# Patient Record
Sex: Female | Born: 1995 | Race: Black or African American | Hispanic: No | Marital: Single | Smoking: Never smoker
Health system: Southern US, Community
[De-identification: ages and names within clinical notes are randomized; demographics above are authoritative.]

## PROBLEM LIST (undated history)

## (undated) DIAGNOSIS — Z789 Other specified health status: Secondary | ICD-10-CM

## (undated) HISTORY — PX: WISDOM TOOTH EXTRACTION: SHX21

---

## 2020-04-08 ENCOUNTER — Other Ambulatory Visit: Payer: Self-pay

## 2020-04-08 ENCOUNTER — Emergency Department (HOSPITAL_COMMUNITY)
Admission: EM | Admit: 2020-04-08 | Discharge: 2020-04-08 | Disposition: A | Discharge status: Home / Self Care | Payer: Medicaid Other | Attending: Emergency Medicine | Admitting: Emergency Medicine

## 2020-04-08 DIAGNOSIS — O99891 Other specified diseases and conditions complicating pregnancy: Secondary | ICD-10-CM | POA: Diagnosis present

## 2020-04-08 DIAGNOSIS — Z3A16 16 weeks gestation of pregnancy: Secondary | ICD-10-CM | POA: Diagnosis not present

## 2020-04-08 DIAGNOSIS — M542 Cervicalgia: Secondary | ICD-10-CM | POA: Diagnosis not present

## 2020-04-08 LAB — POC URINE PREG, ED: Preg Test, Ur: POSITIVE — AB

## 2020-04-08 MED ORDER — ACETAMINOPHEN ER 650 MG PO TBCR
650.0000 mg | EXTENDED_RELEASE_TABLET | Freq: Three times a day (TID) | ORAL | 0 refills | Status: DC | PRN
Start: 2020-04-08 — End: 2024-01-21

## 2020-04-08 MED ORDER — ACETAMINOPHEN 325 MG PO TABS
650.0000 mg | ORAL_TABLET | Freq: Once | ORAL | Status: AC
  Administered 2020-04-08: 650 mg via ORAL
  Filled 2020-04-08: qty 2

## 2020-04-08 MED ORDER — LIDOCAINE 5 % EX PTCH
1.0000 | MEDICATED_PATCH | CUTANEOUS | Status: DC
  Administered 2020-04-08: 1 via TRANSDERMAL
  Filled 2020-04-08: qty 1

## 2020-04-08 NOTE — ED Notes (Signed)
Pt discharge instructions reviewed with the patient. The patient verbalized understanding of instructions. Pt discharged. 

## 2020-04-08 NOTE — Discharge Instructions (Addendum)
As discussed, I think your symptoms are related to muscle pain. I am sending you home with Tylenol as needed for pain. Ice area for symptomatic relief. I have included the number of the OBGYN. Please call today to schedule an appointment to establish care. Return to the ER for new or worsening symptoms.

## 2020-04-08 NOTE — ED Provider Notes (Signed)
[facility] EMERGENCY DEPARTMENT Provider Note   CSN: [id number] Arrival date & time: 04/08/20  7902     History No chief complaint on file.   Brandy Perez is a 24 y.o. female who is currently [redacted]w[redacted]d pregnant with no significant past medical history who presents to the ED due to right-sided neck pain.  Patient states pain started this morning upon waking up and is located from the right side of her neck down the right side of her chest. Pain is worse with movement. No treatment prior to arrival. Denies injury. She describes her pain as "soreness". Denies fever and chills. Denies central chest pain. Denies history of blood clots, recent surgeries, recent long immobilizations, and hormonal treatments. Denies right-sided weakness. Denies numbness/tingling.   Patient is also currently [redacted]w[redacted]d pregnant. She recently moved her from [place] and has not follow-up with an OBGYN since moving here. Her last OB visit was when she was roughly 7 weeks where an Korea was performed that confirmed intrauterine pregnancy. Patient denies abdominal pain, vaginal bleeding, and leakage from the vagina. Unsure when her last menstrual cycle was. She has not established care here in [place] yet.   History obtained from patient and past medical records. No interpreter used during encounter.      No past medical history on file.  There are no problems to display for this patient.   OB History   No obstetric history on file.     No family history on file.  Social History   Tobacco Use  . Smoking status: Not on file  Substance Use Topics  . Alcohol use: Not on file  . Drug use: Not on file    Home Medications Prior to Admission medications   Medication Sig Start Date End Date Taking? Authorizing Provider  acetaminophen (TYLENOL 8 HOUR) 650 MG CR tablet Take 1 tablet (650 mg total) by mouth every 8 (eight) hours as needed for pain. 04/08/20   Mannie Stabile, PA-C    Allergies      Patient has no known allergies.  Review of Systems   Review of Systems  Constitutional: Negative for chills and fever.  Respiratory: Negative for shortness of breath.   Cardiovascular: Negative for chest pain.  Gastrointestinal: Negative for abdominal pain, diarrhea, nausea and vomiting.  Musculoskeletal: Positive for arthralgias and neck pain.  Neurological: Negative for weakness and numbness.  All other systems reviewed and are negative.   Physical Exam Updated Vital Signs BP 130/69 (BP Location: Left Arm)   Pulse 85   Temp 98.2 F (36.8 C) (Oral)   Resp 17   Ht 5\' 5"  (1.651 m)   Wt 52.2 kg   SpO2 99%   BMI 19.14 kg/m   Physical Exam Vitals and nursing note reviewed.  Constitutional:      General: She is not in acute distress.    Appearance: She is not ill-appearing.  HENT:     Head: Normocephalic.  Eyes:     Pupils: Pupils are equal, round, and reactive to light.  Neck:      Comments: Reproducible right-sided neck tenderness. No cervical midline tenderness.  Cardiovascular:     Rate and Rhythm: Normal rate and regular rhythm.     Pulses: Normal pulses.     Heart sounds: Normal heart sounds. No murmur heard.  No friction rub. No gallop.   Pulmonary:     Effort: Pulmonary effort is normal.     Breath sounds: Normal breath sounds.  Abdominal:  General: Abdomen is flat. Bowel sounds are normal. There is no distension.     Palpations: Abdomen is soft.     Tenderness: There is no abdominal tenderness. There is no guarding or rebound.     Comments: Abdomen soft, nondistended, nontender to palpation in all quadrants without guarding or peritoneal signs. No rebound. FHR 130s.   Musculoskeletal:     Cervical back: Neck supple.     Comments: Able to move all 4 extremities without difficulty.  No lower extremity edema. Equal grip strength bilaterally.   Skin:    General: Skin is warm and dry.  Neurological:     General: No focal deficit present.     Mental  Status: She is alert.  Psychiatric:        Mood and Affect: Mood normal.        Behavior: Behavior normal.     ED Results / Procedures / Treatments   Labs (all labs ordered are listed, but only abnormal results are displayed) Labs Reviewed  POC URINE PREG, ED - Abnormal; Notable for the following components:      Result Value   Preg Test, Ur POSITIVE (*)    All other components within normal limits    EKG None  Radiology No results found.  Procedures Procedures (including critical care time)  Medications Ordered in ED Medications  lidocaine (LIDODERM) 5 % 1 patch (1 patch Transdermal Patch Applied 04/08/20 1234)  acetaminophen (TYLENOL) tablet 650 mg (650 mg Oral Given 04/08/20 1232)    ED Course  I have reviewed the triage vital signs and the nursing notes.  Pertinent labs & imaging results that were available during my care of the patient were reviewed by me and considered in my medical decision making (see chart for details).  Clinical Course as of Apr 09 1247  Fri Apr 08, 2020  1216 Preg Test, Ur(!): POSITIVE [CA]    Clinical Course User Index [CA] Jesusita Oka   MDM Rules/Calculators/A&P                         24 year old female presents to the ED due to right-sided neck pain that radiates down the right side of her body after waking up this morning. Denies injury. Denies fever and chills. No central chest pain or shortness of breath. Denies lower extremity edema. She is currently [redacted]w[redacted]d pregnant and recently moved here from [place] with no established OBGYN. No abdominal pain, vaginal bleeding, or fluid leakage. Good fetal HR on exam. No Korea warranted at this time. Upon arrival, vitals all within normal limits.  Patient is afebrile, not tachycardic or hypoxic.  Patient in no acute distress and non-ill-appearing.  Physical exam reassuring.  Reproducible right-sided neck tenderness.  Abdomen soft, nondistended, nontender. No rash to suggest shingles. Suspect  MSK etiology. No cervical midline tenderness. No upper extremity weakness to suggest central cord compression. Patient treated here in the ED with Tylenol and Lidoderm patch with improvement in symptoms. Upon reassessment, patient notes she is ready to go home. Will discharge patient with Tylenol for symptomatic relief. OBGYN number given to patient at discharge. Instructed patient to call today to schedule an appointment to establish OB care. Strict ED precautions discussed with patient. Patient states understanding and agrees to plan. Patient discharged home in no acute distress and stable vitals.  Final Clinical Impression(s) / ED Diagnoses Final diagnoses:  Neck pain  [redacted] weeks gestation of pregnancy    Rx /  DC Orders ED Discharge Orders         Ordered    acetaminophen (TYLENOL 8 HOUR) 650 MG CR tablet  Every 8 hours PRN     Discontinue  Reprint     04/08/20 [address] 04/08/20 1248    Derwood Kaplan, MD 04/09/20 1611

## 2020-04-08 NOTE — ED Triage Notes (Signed)
Pt here with c/o right shoulder and neck pain , pt recalls no trauma , pt is also approx [redacted] weeks pregnant

## 2020-04-20 ENCOUNTER — Other Ambulatory Visit: Payer: Self-pay

## 2020-04-20 ENCOUNTER — Ambulatory Visit (INDEPENDENT_AMBULATORY_CARE_PROVIDER_SITE_OTHER): Payer: Medicaid Other | Admitting: General Practice

## 2020-04-20 DIAGNOSIS — Z3201 Encounter for pregnancy test, result positive: Secondary | ICD-10-CM | POA: Diagnosis not present

## 2020-04-20 LAB — POCT PREGNANCY, URINE: Preg Test, Ur: POSITIVE — AB

## 2020-04-20 MED ORDER — PRENATAL VITAMIN PLUS LOW IRON 27-1 MG PO TABS: 1.0000 | ORAL_TABLET | Freq: Every day | ORAL | 11 refills | Status: DC

## 2020-04-20 NOTE — Progress Notes (Signed)
Patient dropped off urine sample at office for formal pregnancy confirmation. UPT +.  Called patient and she reports first positive home test 01/25/20. She is unsure of her LMP but has had several ultrasounds at a clinic [facility]. Patient states her most recent ultrasound was 5/10 and she was told her due date was 09/17/20. Patient states she hasn't had formal prenatal care thus far in pregnancy and the clinic she has been [facility] like a [facility]. Patient denies taking any meds/vitamins. Rx sent to pharmacy for PNV and patient was informed. Patient has new OB appt in office 8/5 and will follow up then.  Chase Caller RN BSN 04/20/20

## 2020-04-22 NOTE — Progress Notes (Signed)
I have reviewed this chart and agree with the RN/CMA assessment and management.    K. Meryl Abbigal Radich, M.D. Attending [facility] (Faculty Practice)   

## 2020-04-25 ENCOUNTER — Other Ambulatory Visit: Payer: Self-pay

## 2020-04-25 ENCOUNTER — Encounter (HOSPITAL_COMMUNITY): Payer: Self-pay | Admitting: Obstetrics and Gynecology

## 2020-04-25 ENCOUNTER — Inpatient Hospital Stay (HOSPITAL_COMMUNITY)
Admission: EM | Admit: 2020-04-25 | Discharge: 2020-04-25 | Disposition: A | Discharge status: Home / Self Care | Payer: Medicaid Other | Attending: Obstetrics and Gynecology | Admitting: Obstetrics and Gynecology

## 2020-04-25 DIAGNOSIS — O26892 Other specified pregnancy related conditions, second trimester: Secondary | ICD-10-CM | POA: Diagnosis present

## 2020-04-25 DIAGNOSIS — O26852 Spotting complicating pregnancy, second trimester: Secondary | ICD-10-CM | POA: Insufficient documentation

## 2020-04-25 DIAGNOSIS — O99322 Drug use complicating pregnancy, second trimester: Secondary | ICD-10-CM | POA: Diagnosis not present

## 2020-04-25 DIAGNOSIS — O4692 Antepartum hemorrhage, unspecified, second trimester: Secondary | ICD-10-CM | POA: Diagnosis not present

## 2020-04-25 DIAGNOSIS — Z3A19 19 weeks gestation of pregnancy: Secondary | ICD-10-CM | POA: Insufficient documentation

## 2020-04-25 DIAGNOSIS — O0932 Supervision of pregnancy with insufficient antenatal care, second trimester: Secondary | ICD-10-CM | POA: Diagnosis not present

## 2020-04-25 DIAGNOSIS — R109 Unspecified abdominal pain: Secondary | ICD-10-CM | POA: Diagnosis not present

## 2020-04-25 DIAGNOSIS — O0930 Supervision of pregnancy with insufficient antenatal care, unspecified trimester: Secondary | ICD-10-CM

## 2020-04-25 DIAGNOSIS — O368120 Decreased fetal movements, second trimester, not applicable or unspecified: Secondary | ICD-10-CM | POA: Diagnosis not present

## 2020-04-25 DIAGNOSIS — F1290 Cannabis use, unspecified, uncomplicated: Secondary | ICD-10-CM | POA: Diagnosis not present

## 2020-04-25 DIAGNOSIS — O4690 Antepartum hemorrhage, unspecified, unspecified trimester: Secondary | ICD-10-CM

## 2020-04-25 HISTORY — DX: Other specified health status: Z78.9

## 2020-04-25 LAB — URINALYSIS, ROUTINE W REFLEX MICROSCOPIC
Bilirubin Urine: NEGATIVE
Glucose, UA: NEGATIVE mg/dL
Hgb urine dipstick: NEGATIVE
Ketones, ur: NEGATIVE mg/dL
Leukocytes,Ua: NEGATIVE
Nitrite: NEGATIVE
Protein, ur: NEGATIVE mg/dL
Specific Gravity, Urine: 1.017 (ref 1.005–1.030)
pH: 9 — ABNORMAL HIGH (ref 5.0–8.0)

## 2020-04-25 NOTE — MAU Note (Signed)
Started bleeding last night, just spotting this morning. Has been cramping. Sent up from ED.  Has 1st appt on 8/5.  ( [facility] [place])

## 2020-04-25 NOTE — Progress Notes (Signed)
Pt left AMA after speaking with the provider. See provider notes for more information. Patient walked out of unit before signing AMA form. Previously reported cramping pain score of 7-8. All vital signs were WNL.

## 2020-04-25 NOTE — ED Triage Notes (Signed)
Pt with decreased fetal movement and vaginal bleeding. States [redacted]w[redacted]d pregnant with 3rd.

## 2020-04-25 NOTE — MAU Provider Note (Signed)
Chief Complaint: Vaginal Bleeding and Abdominal Pain   First Provider Initiated Contact with Patient 04/25/20 1144      SUBJECTIVE HPI: Brandy Perez is a 24 y.o. G3P1102 at [redacted]w[redacted]d by early ultrasound who presents to maternity admissions reporting abdominal cramping and light vaginal bleeding starting last night.  She reports seeing light red last night, enough to need a pad but this slowed to only light spotting today. Cramping started with the bleeding and has continued.     Location: lower abdomen Quality: cramping, sharp pain Severity: 7/10 on pain scale Duration: 12-15 hours Timing: intermittent, pt unable to time but reports they are frequent Modifying factors: none Associated signs and symptoms: vaginal bleeding  HPI  Past Medical History:  Diagnosis Date  . Medical history non-contributory    Past Surgical History:  Procedure Laterality Date  . WISDOM TOOTH EXTRACTION     Social History   Socioeconomic History  . Marital status: Single    Spouse name: Not on file  . Number of children: Not on file  . Years of education: Not on file  . Highest education level: Not on file  Occupational History  . Not on file  Tobacco Use  . Smoking status: Never Smoker  . Smokeless tobacco: Never Used  Vaping Use  . Vaping Use: Never used  Substance and Sexual Activity  . Alcohol use: Not Currently  . Drug use: Yes    Types: Marijuana  . Sexual activity: Yes  Other Topics Concern  . Not on file  Social History Narrative  . Not on file   Social Determinants of Health   Financial Resource Strain:   . Difficulty of Paying Living Expenses:   Food Insecurity:   . Worried About Programme researcher, broadcasting/film/video in the Last Year:   . Barista in the Last Year:   Transportation Needs:   . Freight forwarder (Medical):   Marland Kitchen Lack of Transportation (Non-Medical):   Physical Activity:   . Days of Exercise per Week:   . Minutes of Exercise per Session:   Stress:   . Feeling of  Stress :   Social Connections:   . Frequency of Communication with Friends and Family:   . Frequency of Social Gatherings with Friends and Family:   . Attends Religious Services:   . Active Member of Clubs or Organizations:   . Attends Banker Meetings:   Marland Kitchen Marital Status:   Intimate Partner Violence:   . Fear of Current or Ex-Partner:   . Emotionally Abused:   Marland Kitchen Physically Abused:   . Sexually Abused:    No current facility-administered medications on file prior to encounter.   Current Outpatient Medications on File Prior to Encounter  Medication Sig Dispense Refill  . acetaminophen (TYLENOL 8 HOUR) 650 MG CR tablet Take 1 tablet (650 mg total) by mouth every 8 (eight) hours as needed for pain. 15 tablet 0  . Prenatal Vit-Fe Fumarate-FA (PRENATAL VITAMIN PLUS LOW IRON) 27-1 MG TABS Take 1 tablet by mouth daily. 30 tablet 11   No Known Allergies  ROS:  Review of Systems  Constitutional: Negative for chills, fatigue and fever.  Respiratory: Negative for shortness of breath.   Cardiovascular: Negative for chest pain.  Gastrointestinal: Positive for abdominal pain.  Genitourinary: Positive for vaginal bleeding. Negative for difficulty urinating, dysuria, flank pain, pelvic pain, vaginal discharge and vaginal pain.  Neurological: Negative for dizziness and headaches.  Psychiatric/Behavioral: Negative.  I have reviewed patient's Past Medical Hx, Surgical Hx, Family Hx, Social Hx, medications and allergies.   Physical Exam   Patient Vitals for the past 24 hrs:  BP Temp Temp src Pulse Resp SpO2  04/25/20 1119 127/73 -- -- 77 -- --  04/25/20 1115 -- -- -- -- -- 100 %  04/25/20 1110 -- -- -- -- -- 100 %  04/25/20 1054 121/78 -- -- 83 16 100 %  04/25/20 1007 123/71 98.1 F (36.7 C) Oral 81 16 100 %   Constitutional: Well-developed, well-nourished female in no acute distress.  Cardiovascular: normal rate Respiratory: normal effort GI: Abd soft, non-tender. Pos  BS x 4 MS: Extremities nontender, no edema, normal ROM Neurologic: Alert and oriented x 4.  GU: Neg CVAT.  Pelvic exam: Pt declined  FHT 145 by doppler  LAB RESULTS No results found for this or any previous visit (from the past 24 hour(s)).     IMAGING No results found.  MAU Management/MDM: Orders Placed This Encounter  Procedures  . Urinalysis, Routine w reflex microscopic    No orders of the defined types were placed in this encounter.   History taken, discussed plan with patient to look for causes for bleeding and pain.  Pt has new OB visit on 05/05/20 and provider discussed ordering  anatomy US for patient today so she will not have delay.  Pt asked for ultrasound today "to make sure my baby is Ok".  Provider reassured pt that FHT were normal today and Korea may not be needed if bleeding is scant.  Pt reports there is very little bleeding today but she wants ultrasound because she has not had prenatal care yet and "no one has looked to see if my baby is Ok".   Provider again recommended evaluating with pelvic exam for causes of bleeding/pain and pt declined.  She left MAU AMA.  Outpatient anatomy US ordered as originally offered. Pt to f/u with new OB appt on 05/05/20 at [facility].      ASSESSMENT 1. Vaginal bleeding in pregnancy     PLAN Pt left AMA She has outpatient Korea and new OB appt for follow up   Sharen Counter Certified Nurse-Midwife 04/25/2020  11:45 AM

## 2020-04-25 NOTE — ED Provider Notes (Signed)
[facility] EMERGENCY DEPARTMENT Provider Note   CSN: [id number] Arrival date & time: 04/25/20  1001     History Chief Complaint  Patient presents with  . Vaginal Bleeding    Byanca Kasper is a 24 y.o. female G3P2 approximately 19 weeks presents for evaluation of abdominal cramping and vaginal bleeding.  Began yesterday evening.  [facility] [place] at beginning of pregnancy however has not followed up with outpatient OB/GYN follow-up.  She is taking prenatal vitamins.  States initially had heavy bright red bleeding yesterday evening which has gradually tapered off.  She is only had to change her pad once today.  No lightheadedness, dizziness.  No sensation of pushing, pelvic pain or fluid leakage.  Denies additional aggrieving or relieving factors.  History obtained from patient and past medical records.  No interpreter is used.  HPI     No past medical history on file.  There are no problems to display for this patient.   History reviewed  OB History   No obstetric history on file.     No family history on file.  Social History   Tobacco Use  . Smoking status: Not on file  Substance Use Topics  . Alcohol use: Not on file  . Drug use: Not on file    Home Medications Prior to Admission medications   Medication Sig Start Date End Date Taking? Authorizing Provider  acetaminophen (TYLENOL 8 HOUR) 650 MG CR tablet Take 1 tablet (650 mg total) by mouth every 8 (eight) hours as needed for pain. 04/08/20   Mannie Stabile, PA-C  Prenatal Vit-Fe Fumarate-FA (PRENATAL VITAMIN PLUS LOW IRON) 27-1 MG TABS Take 1 tablet by mouth daily. 04/20/20   Conan Bowens, MD    Allergies    Patient has no known allergies.  Review of Systems   Review of Systems  Constitutional: Negative.   HENT: Negative.   Respiratory: Negative.   Cardiovascular: Negative.   Gastrointestinal: Positive for abdominal pain  (Cramping).  Genitourinary: Positive for vaginal bleeding.  Musculoskeletal: Negative.   Skin: Negative.   Neurological: Negative.   All other systems reviewed and are negative.   Physical Exam Updated Vital Signs BP 123/71 (BP Location: Right Arm)   Pulse 81   Temp 98.1 F (36.7 C) (Oral)   Resp 16   SpO2 100%   Physical Exam Vitals and nursing note reviewed.  Constitutional:      General: She is not in acute distress.    Appearance: She is well-developed. She is not ill-appearing or toxic-appearing.  HENT:     Head: Atraumatic.  Eyes:     Pupils: Pupils are equal, round, and reactive to light.  Cardiovascular:     Rate and Rhythm: Normal rate.  Pulmonary:     Effort: No respiratory distress.  Abdominal:     General: There is no distension.     Comments: Gravid abdomen to umbilicus  Genitourinary:    Comments: Defer to MAU provider Musculoskeletal:        General: Normal range of motion.     Cervical back: Normal range of motion.  Skin:    General: Skin is warm and dry.  Neurological:     Mental Status: She is alert.     Gait: Gait is intact.     Comments: Ambulatory in room     ED Results / Procedures / Treatments   Labs (all labs ordered are listed, but  only abnormal results are displayed) Labs Reviewed - No data to display  EKG None  Radiology No results found.  Procedures Procedures (including critical care time)  Medications Ordered in ED Medications - No data to display  ED Course  I have reviewed the triage vital signs and the nursing notes.  Pertinent labs & imaging results that were available during my care of the patient were reviewed by me and considered in my medical decision making (see chart for details).  24 year old G3, P2 proximately 19 weeks presents for evaluation of lower abdominal cramping as well as vaginal bleeding.  Began yesterday evening however bleeding has gradually tapered off.  No lightheadedness or dizziness.  She is  afebrile, nonseptic, not ill-appearing.  She has stable vital signs.  Abdomen gravid up to umbilicus.  Has not established outpatient OB/GYN care throughout this pregnancy. Patient does not here to be in active distress low suspicion for imminent delivery of fetus.  Discussed with MAU provider who agrees to accept patient in transfer.  Patient hemodynamically stable prior to transfer to MAU.    MDM Rules/Calculators/A&P                           Final Clinical Impression(s) / ED Diagnoses Final diagnoses:  Vaginal bleeding in pregnancy    Rx / DC Orders ED Discharge Orders    None       Kevonte Vanecek A, PA-C 04/25/20 1030    Mancel Bale, MD 04/25/20 1738

## 2020-05-05 ENCOUNTER — Encounter: Payer: Self-pay | Admitting: Obstetrics and Gynecology

## 2020-05-05 ENCOUNTER — Ambulatory Visit (INDEPENDENT_AMBULATORY_CARE_PROVIDER_SITE_OTHER): Payer: Medicaid Other | Admitting: Obstetrics and Gynecology

## 2020-05-05 ENCOUNTER — Other Ambulatory Visit: Payer: Self-pay

## 2020-05-05 ENCOUNTER — Other Ambulatory Visit (HOSPITAL_COMMUNITY)
Admission: RE | Admit: 2020-05-05 | Discharge: 2020-05-05 | Disposition: A | Discharge status: Home / Self Care | Payer: Medicaid Other | Source: Ambulatory Visit | Attending: Obstetrics and Gynecology | Admitting: Obstetrics and Gynecology

## 2020-05-05 VITALS — BP 126/85 | HR 96 | Wt 110.4 lb

## 2020-05-05 DIAGNOSIS — Z8759 Personal history of other complications of pregnancy, childbirth and the puerperium: Secondary | ICD-10-CM

## 2020-05-05 DIAGNOSIS — O0990 Supervision of high risk pregnancy, unspecified, unspecified trimester: Secondary | ICD-10-CM | POA: Insufficient documentation

## 2020-05-05 DIAGNOSIS — G43909 Migraine, unspecified, not intractable, without status migrainosus: Secondary | ICD-10-CM | POA: Insufficient documentation

## 2020-05-05 DIAGNOSIS — F1291 Cannabis use, unspecified, in remission: Secondary | ICD-10-CM

## 2020-05-05 DIAGNOSIS — Z789 Other specified health status: Secondary | ICD-10-CM

## 2020-05-05 DIAGNOSIS — Z87898 Personal history of other specified conditions: Secondary | ICD-10-CM | POA: Insufficient documentation

## 2020-05-05 DIAGNOSIS — G43809 Other migraine, not intractable, without status migrainosus: Secondary | ICD-10-CM

## 2020-05-05 DIAGNOSIS — O0930 Supervision of pregnancy with insufficient antenatal care, unspecified trimester: Secondary | ICD-10-CM

## 2020-05-05 DIAGNOSIS — Z98891 History of uterine scar from previous surgery: Secondary | ICD-10-CM

## 2020-05-05 NOTE — Progress Notes (Signed)
New OB Note  05/05/2020   Clinic: Center for [facility]  Chief Complaint: NOB  Transfer of Care Patient: no  History of Present Illness: Brandy Perez is a 24 y.o. Q7Y1950 @ 20/5 weeks (EDC 12/18 [tentative], based on LMP.  Preg complicated by has History of VBAC; Migraines; History of marijuana use; History of poor fetal growth; and History of placenta abruption on their problem list.   No s/s of PTL. ?fetal quickening.   ROS: A 12-point review of systems was performed and negative, except as stated in the above HPI.  OBGYN History: As per HPI. OB History  Gravida Para Term Preterm AB Living  3 2 1 1   2   SAB TAB Ectopic Multiple Live Births          2    # Outcome Date GA Lbr Len/2nd Weight Sex Delivery Anes PTL Lv  3 Current           2 Term 08/02/18 [redacted]w[redacted]d  7 lb 2 oz (3.232 kg) M Vag-Spont EPI Y LIV  1 Preterm 02/03/11 [redacted]w[redacted]d  5 lb 1 oz (2.296 kg) F CS-LTranv Spinal  LIV     Complications: Abruptio Placenta    Prior children are healthy, doing well, and without any problems or issues: yes   Past Medical History: Past Medical History:  Diagnosis Date  . Medical history non-contributory     Past Surgical History: Past Surgical History:  Procedure Laterality Date  . CESAREAN SECTION    . WISDOM TOOTH EXTRACTION      Family History:  History reviewed. No pertinent family history.   Social History:  Social History   Socioeconomic History  . Marital status: Single    Spouse name: Not on file  . Number of children: Not on file  . Years of education: Not on file  . Highest education level: Not on file  Occupational History  . Not on file  Tobacco Use  . Smoking status: Never Smoker  . Smokeless tobacco: Never Used  Vaping Use  . Vaping Use: Never used  Substance and Sexual Activity  . Alcohol use: Not Currently  . Drug use: Yes    Types: Marijuana  . Sexual activity: Yes    Birth control/protection: None  Other Topics Concern  .  Not on file  Social History Narrative  . Not on file   Social Determinants of Health   Financial Resource Strain:   . Difficulty of Paying Living Expenses:   Food Insecurity:   . Worried About [redacted]w[redacted]d in the Last Year:   . Programme researcher, broadcasting/film/video in the Last Year:   Transportation Needs:   . Barista (Medical):   Freight forwarder Lack of Transportation (Non-Medical):   Physical Activity:   . Days of Exercise per Week:   . Minutes of Exercise per Session:   Stress:   . Feeling of Stress :   Social Connections:   . Frequency of Communication with Friends and Family:   . Frequency of Social Gatherings with Friends and Family:   . Attends Religious Services:   . Active Member of Clubs or Organizations:   . Attends [employer] Meetings:   Banker Marital Status:   Intimate Partner Violence:   . Fear of Current or Ex-Partner:   . Emotionally Abused:   [employer] Physically Abused:   . Sexually Abused:     Allergy: No Known Allergies  Current Outpatient Medications: Prenatal vitamin  Physical Exam:   BP 126/85   Pulse 96   Wt 110 lb 6.4 oz (50.1 kg)   BMI 18.37 kg/m  Body mass index is 18.37 kg/m. Contractions: Not present Vag. Bleeding: None. Fundal height: 18 FHTs: 150s  General appearance: Well nourished, well developed female in no acute distress.  Neck:  Supple, normal appearance, and no thyromegaly  Cardiovascular: S1, S2 normal, no murmur, rub or gallop, regular rate and rhythm Respiratory:  Clear to auscultation bilateral. Normal respiratory effort Abdomen: positive bowel sounds and no masses, hernias; diffusely non tender to palpation, non distended Breasts: patient denies any breast s/s. Neuro/Psych:  Normal mood and affect.  Skin:  Warm and dry.  Lymphatic:  No inguinal lymphadenopathy.   Pelvic exam: is not limited by body habitus EGBUS: within normal limits, Vagina: within normal limits and with no blood in the vault, Cervix: normal appearing cervix  without discharge or lesions, closed/long/high, Uterus:  enlarged, c/w 18-20 week size, and Adnexa:  normal adnexa and no mass, fullness, tenderness  Laboratory: deferred  Imaging:  None. Pt [facility]. No records available  Assessment: pt doing well  Plan: 1. Supervision of high risk pregnancy, antepartum Routine care. Has anatomy for next week. Pt dehydrated so unable to do natera screening today. Offer at [employer].  - Cytology - PAP( [facility]) - Culture, OB Urine - CBC/D/Plt+RPR+Rh+ABO+Rub Ab... - AFP, Serum, Open Spina Bifida - VITAMIN D 25 Hydroxy (Vit-D Deficiency, Fractures)  2. Weight gain advised 25-30lbs. She said got into the 120s with her las pregnancy  3. History of VBAC Desires with this one. Can d/w her more later and sign consent  4. Other migraine without status migrainosus, not intractable  5. History of marijuana use counseled  6. History of poor fetal growth Resolved at 35wks with last pregnancy. Follow [facility]Hs. May consider q4-6wk serial growth u/s  7. History of placenta abruption With G1 at 36wks and need for c/s.   Problem list reviewed and updated.  Follow up in 4 weeks.  The nature of [facility] with multiple MDs and other Advanced Practice Providers was explained to patient; also emphasized that residents, students are part of our team.  >50% of 30 min visit spent on counseling and coordination of care.     Cornelia Copa MD Attending Center for [facility])

## 2020-05-07 LAB — AFP, SERUM, OPEN SPINA BIFIDA
AFP MoM: 0.79
AFP Value: 66.5 ng/mL
Gest. Age on Collection Date: 20.7 weeks
Maternal Age At EDD: 26.4 yr
OSBR Risk 1 IN: 10000
Test Results:: NEGATIVE
Weight: 115 [lb_av]

## 2020-05-07 LAB — CBC/D/PLT+RPR+RH+ABO+RUB AB...
Antibody Screen: NEGATIVE
Basophils Absolute: 0.1 10*3/uL (ref 0.0–0.2)
Basos: 1 %
EOS (ABSOLUTE): 0.2 10*3/uL (ref 0.0–0.4)
Eos: 2 %
HCV Ab: <0.1 s/co ratio (ref 0.0–0.9)
HIV Screen 4th Generation wRfx: NONREACTIVE
Hematocrit: 34.1 % (ref 34.0–46.6)
Hemoglobin: 11.8 g/dL (ref 11.1–15.9)
Hepatitis B Surface Ag: NEGATIVE
Immature Grans (Abs): 0.1 10*3/uL (ref 0.0–0.1)
Immature Granulocytes: 1 %
Lymphocytes Absolute: 2.3 10*3/uL (ref 0.7–3.1)
Lymphs: 25 %
MCH: 30.3 pg (ref 26.6–33.0)
MCHC: 34.6 g/dL (ref 31.5–35.7)
MCV: 87 fL (ref 79–97)
Monocytes Absolute: 0.8 10*3/uL (ref 0.1–0.9)
Monocytes: 8 %
Neutrophils Absolute: 5.8 10*3/uL (ref 1.4–7.0)
Neutrophils: 63 %
Platelets: 274 10*3/uL (ref 150–450)
RBC: 3.9 x10E6/uL (ref 3.77–5.28)
RDW: 13.1 % (ref 11.7–15.4)
RPR Ser Ql: NONREACTIVE
Rh Factor: POSITIVE
Rubella Antibodies, IGG: 1.28 index (ref >0.99)
WBC: 9.2 10*3/uL (ref 3.4–10.8)

## 2020-05-07 LAB — CULTURE, OB URINE

## 2020-05-07 LAB — VITAMIN D 25 HYDROXY (VIT D DEFICIENCY, FRACTURES): Vit D, 25-Hydroxy: 19.7 ng/mL — ABNORMAL LOW (ref 30.0–100.0)

## 2020-05-07 LAB — URINE CULTURE, OB REFLEX

## 2020-05-07 LAB — HCV INTERPRETATION

## 2020-05-09 ENCOUNTER — Encounter: Payer: Self-pay | Admitting: Obstetrics and Gynecology

## 2020-05-09 ENCOUNTER — Telehealth: Payer: Self-pay | Admitting: Lactation Services

## 2020-05-09 DIAGNOSIS — R7989 Other specified abnormal findings of blood chemistry: Secondary | ICD-10-CM | POA: Insufficient documentation

## 2020-05-09 LAB — CYTOLOGY - PAP
Chlamydia: NEGATIVE
Comment: NEGATIVE
Comment: NORMAL
Diagnosis: NEGATIVE
Neisseria Gonorrhea: NEGATIVE

## 2020-05-09 MED ORDER — VITAMIN D (ERGOCALCIFEROL) 1.25 MG (50000 UNIT) PO CAPS
50000.0000 [IU] | ORAL_CAPSULE | ORAL | 0 refills | Status: DC
Start: 2020-05-09 — End: 2020-07-15

## 2020-05-09 NOTE — Telephone Encounter (Signed)
Called patient to inform her of results of Vitamin D and prescription at the Pharmacy.   Patient did not answer.LM for patient to call the office for results. LM for patient that a prescription has been sent to her Pharmacy and advised to pick up and start taking. Call back number given.

## 2020-05-09 NOTE — Progress Notes (Signed)
Home medicaid form complete.

## 2020-05-09 NOTE — Addendum Note (Signed)
Addended by: Penuelas Bing on: 05/09/2020 08:23 AM   Modules accepted: Orders

## 2020-05-09 NOTE — Telephone Encounter (Signed)
-----   Message from [place] Bing, MD sent at 05/09/2020  8:21 AM EDT ----- Can you tell her that I sent in vitamin d for her b/c it was low? thanks

## 2020-05-11 ENCOUNTER — Ambulatory Visit: Payer: Medicaid Other | Attending: Advanced Practice Midwife

## 2020-05-11 ENCOUNTER — Other Ambulatory Visit: Payer: Self-pay | Admitting: *Deleted

## 2020-05-11 ENCOUNTER — Other Ambulatory Visit: Payer: Self-pay

## 2020-05-11 ENCOUNTER — Encounter: Payer: Self-pay | Admitting: *Deleted

## 2020-05-11 DIAGNOSIS — O0932 Supervision of pregnancy with insufficient antenatal care, second trimester: Secondary | ICD-10-CM | POA: Diagnosis present

## 2020-05-11 DIAGNOSIS — O09212 Supervision of pregnancy with history of pre-term labor, second trimester: Secondary | ICD-10-CM

## 2020-05-11 DIAGNOSIS — O09292 Supervision of pregnancy with other poor reproductive or obstetric history, second trimester: Secondary | ICD-10-CM

## 2020-05-11 DIAGNOSIS — Z3A20 20 weeks gestation of pregnancy: Secondary | ICD-10-CM | POA: Insufficient documentation

## 2020-05-11 DIAGNOSIS — O34219 Maternal care for unspecified type scar from previous cesarean delivery: Secondary | ICD-10-CM | POA: Diagnosis not present

## 2020-05-11 DIAGNOSIS — Z362 Encounter for other antenatal screening follow-up: Secondary | ICD-10-CM

## 2020-05-11 DIAGNOSIS — Z3A19 19 weeks gestation of pregnancy: Secondary | ICD-10-CM

## 2020-05-11 DIAGNOSIS — O0930 Supervision of pregnancy with insufficient antenatal care, unspecified trimester: Secondary | ICD-10-CM

## 2020-06-08 ENCOUNTER — Ambulatory Visit: Payer: Medicaid Other | Attending: Obstetrics and Gynecology

## 2020-06-08 ENCOUNTER — Encounter: Payer: Self-pay | Admitting: *Deleted

## 2020-06-08 ENCOUNTER — Ambulatory Visit: Payer: Medicaid Other | Admitting: *Deleted

## 2020-06-08 ENCOUNTER — Other Ambulatory Visit: Payer: Self-pay

## 2020-06-08 VITALS — BP 134/78 | HR 92

## 2020-06-08 DIAGNOSIS — O09212 Supervision of pregnancy with history of pre-term labor, second trimester: Secondary | ICD-10-CM | POA: Diagnosis not present

## 2020-06-08 DIAGNOSIS — O09292 Supervision of pregnancy with other poor reproductive or obstetric history, second trimester: Secondary | ICD-10-CM | POA: Diagnosis not present

## 2020-06-08 DIAGNOSIS — Z362 Encounter for other antenatal screening follow-up: Secondary | ICD-10-CM

## 2020-06-08 DIAGNOSIS — O34219 Maternal care for unspecified type scar from previous cesarean delivery: Secondary | ICD-10-CM

## 2020-06-08 DIAGNOSIS — Z3A24 24 weeks gestation of pregnancy: Secondary | ICD-10-CM

## 2020-06-08 IMAGING — US US MFM OB FOLLOW-UP
1 series · 14 of 28 positions shown · non-contrast
Comparison: none

[Series 1: us mfm ob follow-up · 42 acquisitions, 14 frames shown]
[im 2/42]
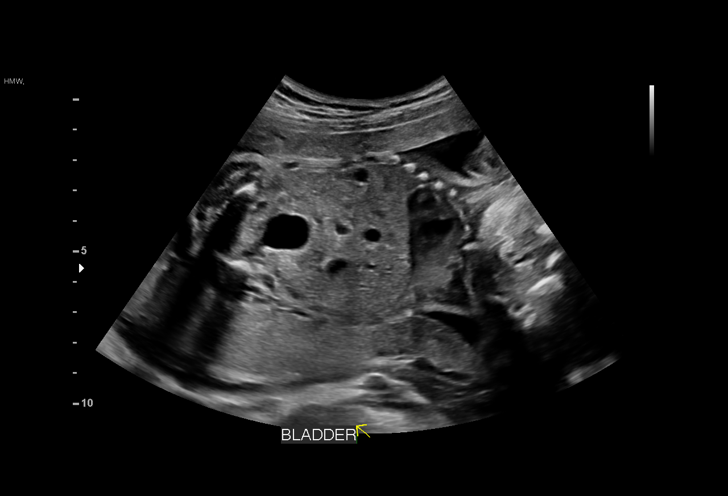
[im 5/42]
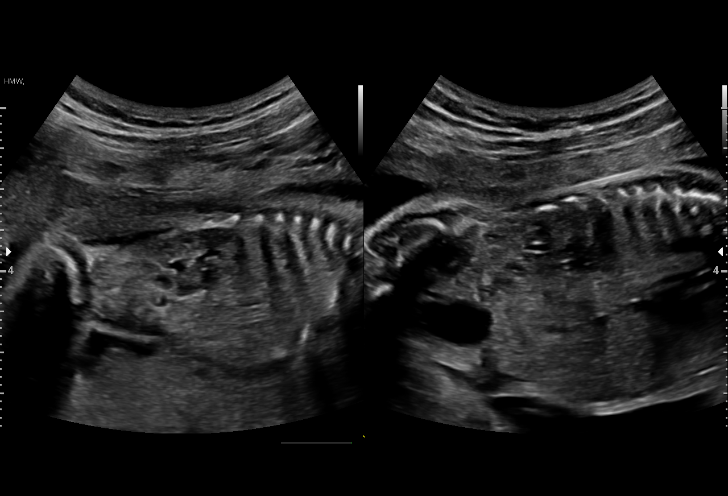
[im 8/42]
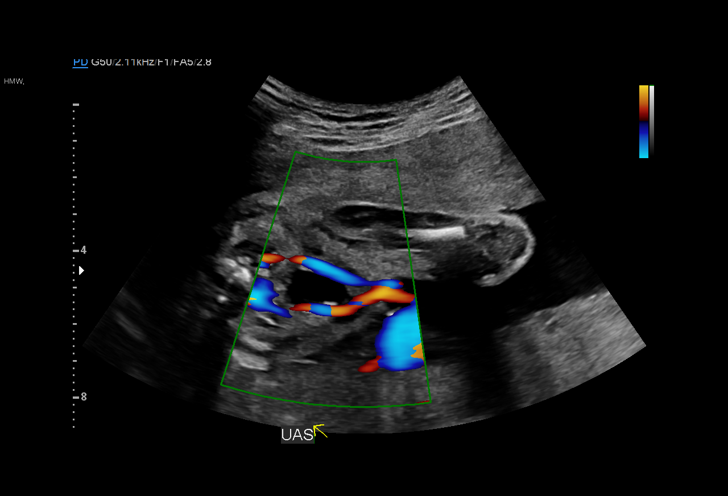
[im 11/42]
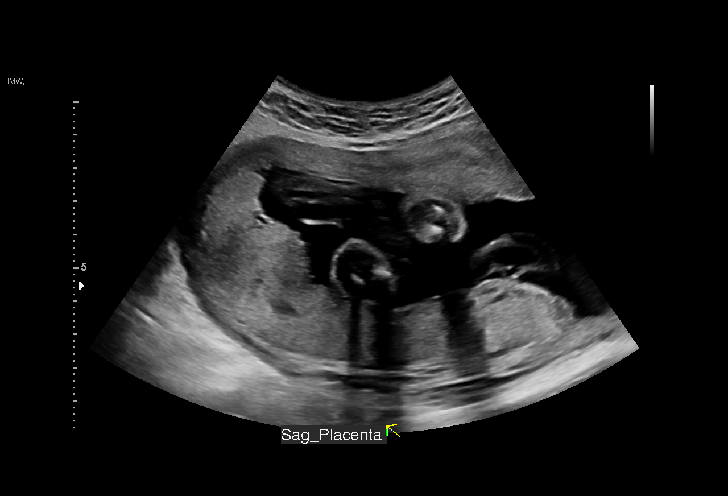
[im 14/42]
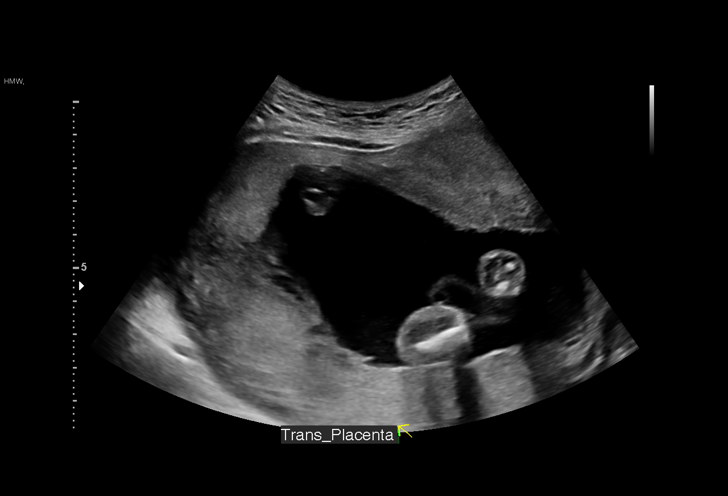
[im 17/42]
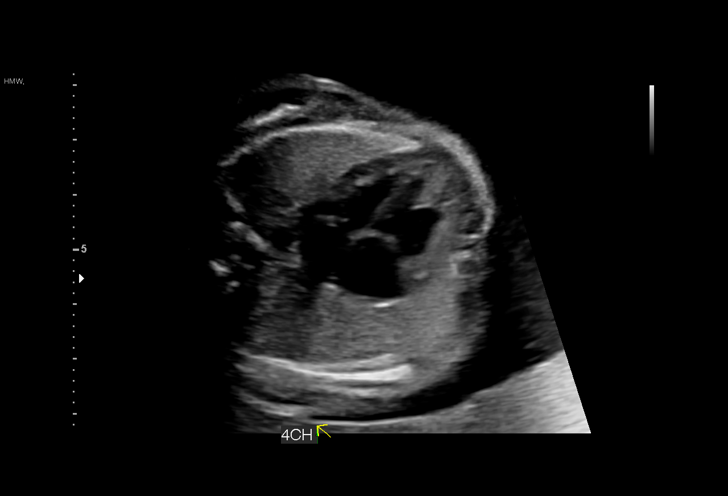
[im 20/42]
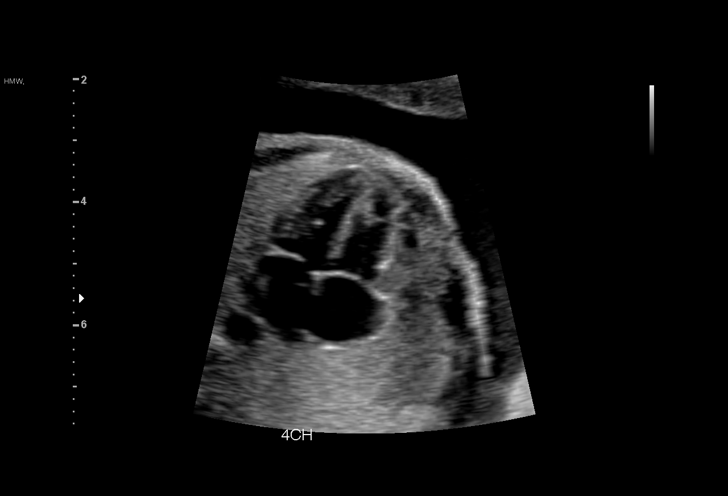
[im 23/42]
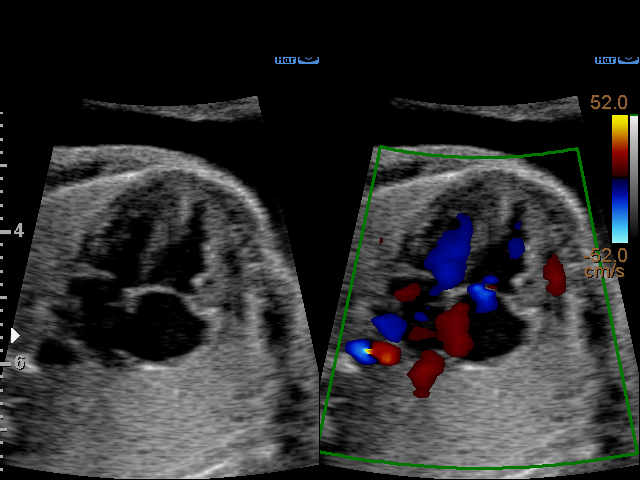
[im 26/42]
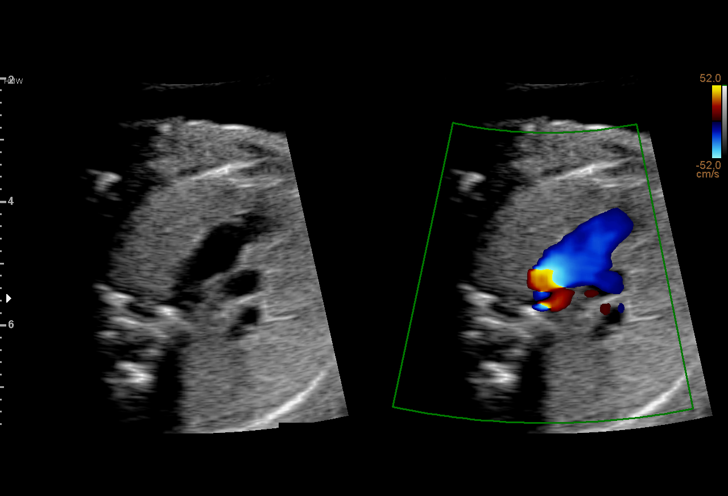
[im 29/42]
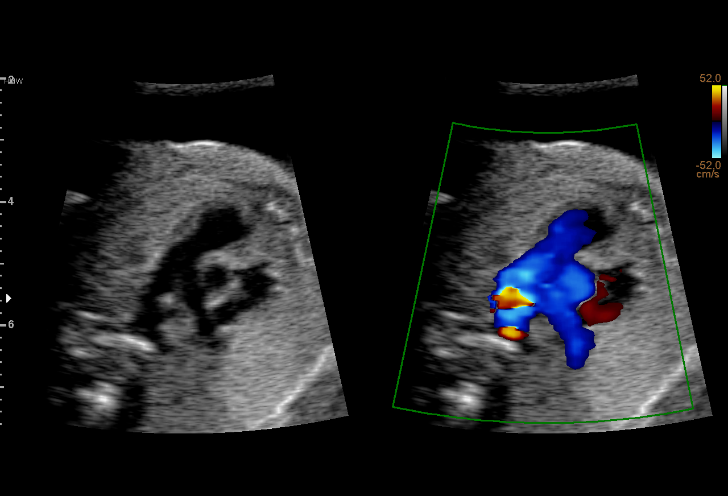
[im 32/42]
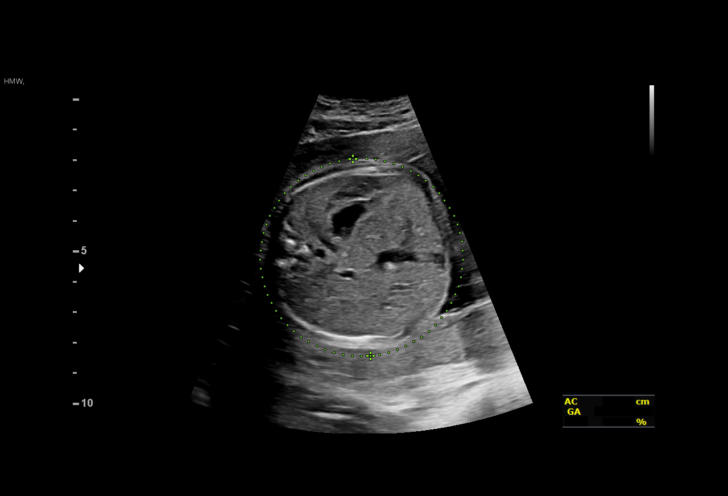
[im 35/42]
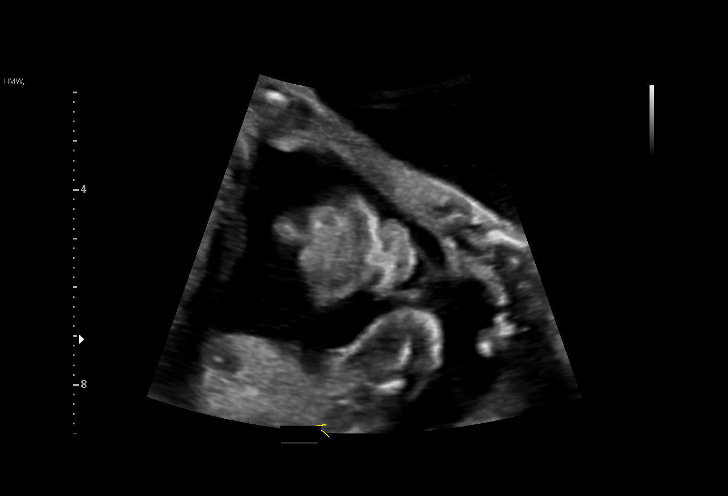
[im 38/42]
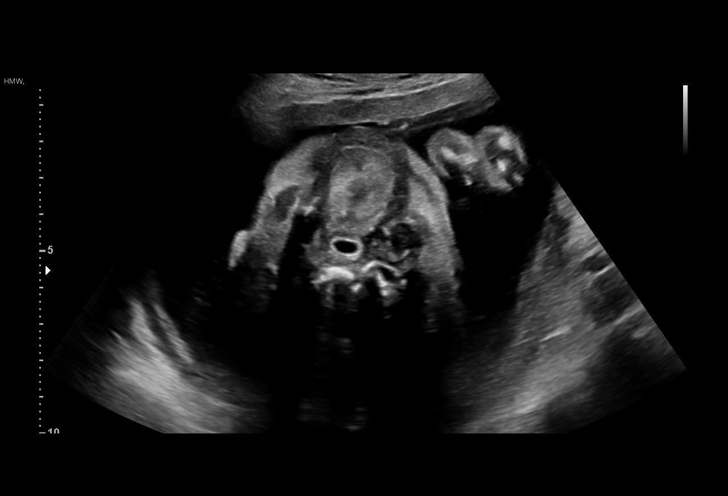
[im 42/42]
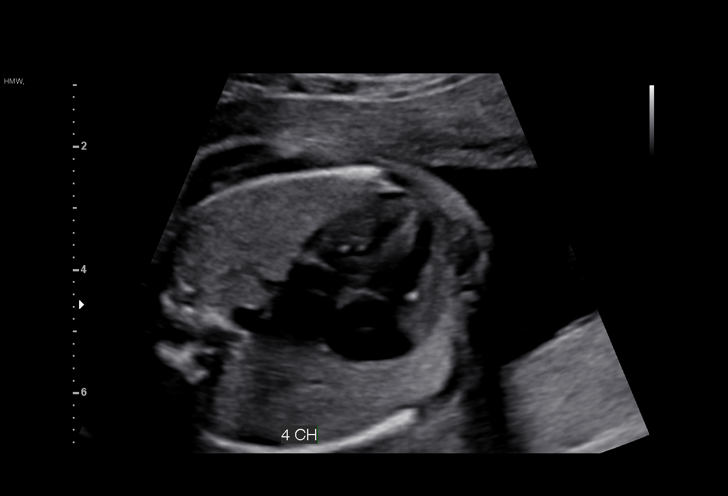

[14 of 28 positions shown; findings below may reference images not displayed]

Indications

 24 weeks gestation of pregnancy
 Prior poor obstetrical history antepartum,      [LQ]
 second trimester (placental abruption)
 History of cesarean delivery, currently         [LQ]
 pregnant
 History of pre-term deliveries                  [LQ]
 Encounter for other antenatal screening         [LQ]
 follow-up
Fetal Evaluation

 Num Of Fetuses:          1
 Fetal Heart Rate(bpm):   151
 Cardiac Activity:        Observed
 Presentation:            Cephalic
 Placenta:                Posterior
 P. Cord Insertion:       Visualized, central

 Amniotic Fluid
 AFI FV:      Within normal limits

                             Largest Pocket(cm)

Biometry
 BPD:      64.7  mm     G. Age:  26w 1d         94  %    CI:        79.66   %    70 - 86
                                                         FL/HC:       18.2  %    18.7 -
 HC:      229.1  mm     G. Age:  25w 0d         56  %    HC/AC:       1.11       1.05 -
 AC:      205.7  mm     G. Age:  25w 1d         69  %    FL/BPD:      64.5  %    71 - 87
 FL:       41.7  mm     G. Age:  23w 4d         17  %    FL/AC:       20.3  %    20 - 24

 Est. FW:     715   gm     1 lb 9 oz     56  %
OB History

 Gravidity:    3         Term:   1        Prem:   1
 Living:       2
Gestational Age

 Clinical EDD:  25w 4d                                        EDD:   [DATE]
 U/S Today:     25w 0d                                        EDD:   [DATE]
 Best:          24w 2d     Det. By:  U/S  ([DATE])          EDD:   [DATE]
Anatomy

 Cranium:               Appears normal         LVOT:                   Appears normal
 Cavum:                 Previously seen        Aortic Arch:            Previously seen
 Ventricles:            Appears normal         Ductal Arch:            Previously seen
 Choroid Plexus:        Previously seen        Diaphragm:              Appears normal
 Cerebellum:            Previously seen        Stomach:                Appears normal, left
                                                                       sided
 Posterior Fossa:       Previously seen        Abdomen:                Previously seen
 Nuchal Fold:           Not applicable (>20    Abdominal Wall:         Previously seen
                        wks GA)
 Face:                  Appears normal         Cord Vessels:           Previously seen
                        (orbits and profile)
 Lips:                  Appears normal         Kidneys:                Appear normal
 Palate:                Previously seen        Bladder:                Appears normal
 Thoracic:              Appears normal         Spine:                  Previously seen
 Heart:                 Appears normal; EIF    Upper Extremities:      Previously seen
 RVOT:                  Appears normal         Lower Extremities:      Previously seen

 Other:  Fetus appears to be female. Heels/feet and open hands/5th digits
         visualized prev. Open hands visualized prev. 3VV and 3VTV
         visualized prev.
Cervix Uterus Adnexa

 Cervix
 Not visualized (advanced GA >[LQ])

 Uterus
 No abnormality visualized.

 Right Ovary
 No adnexal mass visualized.

 Left Ovary
 No adnexal mass visualized.
 Cul De Sac
 No free fluid seen.

 Adnexa
 No abnormality visualized.
Impression

 Follow up growth due to suboptimal anatomy obtained
 previously.
 Normal interval growth with measurements consistent with
 dates established at last visit
 There is good fetal movment and amniotic fluid volume.
Recommendations

 Follow up as clinically indicated.

## 2020-06-09 ENCOUNTER — Encounter: Payer: Medicaid Other | Admitting: Nurse Practitioner

## 2020-06-14 ENCOUNTER — Encounter: Payer: Medicaid Other | Admitting: Certified Nurse Midwife

## 2020-06-27 ENCOUNTER — Other Ambulatory Visit: Payer: Self-pay

## 2020-06-27 ENCOUNTER — Ambulatory Visit (INDEPENDENT_AMBULATORY_CARE_PROVIDER_SITE_OTHER): Payer: Medicaid Other | Admitting: Student

## 2020-06-27 VITALS — BP 121/82 | HR 96 | Wt 118.0 lb

## 2020-06-27 DIAGNOSIS — Z3402 Encounter for supervision of normal first pregnancy, second trimester: Secondary | ICD-10-CM

## 2020-06-27 DIAGNOSIS — Z3A27 27 weeks gestation of pregnancy: Secondary | ICD-10-CM

## 2020-06-27 DIAGNOSIS — Z8759 Personal history of other complications of pregnancy, childbirth and the puerperium: Secondary | ICD-10-CM

## 2020-06-27 MED ORDER — PRENATAL GUMMIES/DHA & FA 0.4-32.5 MG PO CHEW: 1.0000 | CHEWABLE_TABLET | Freq: Every day | ORAL | 2 refills | Status: DC

## 2020-06-27 NOTE — Progress Notes (Signed)
   PRENATAL VISIT NOTE  Subjective:  Brandy Perez is a 24 y.o. G3P1102 at [redacted]w[redacted]d being seen today for ongoing prenatal care.  She is currently monitored for the following issues for this high-risk pregnancy and has History of VBAC; Migraines; History of marijuana use; History of poor fetal growth; History of placenta abruption; Supervision of high risk pregnancy, antepartum; Late prenatal care; and Low vitamin D level on their problem list.  Patient reports no complaints.  Contractions: Not present. Vag. Bleeding: None.  Movement: Present. Denies leaking of fluid.   The following portions of the patient's history were reviewed and updated as appropriate: allergies, current medications, past family history, past medical history, past social history, past surgical history and problem list.   Objective:   Vitals:   06/27/20 1056  BP: 121/82  Pulse: 96  Weight: 118 lb (53.5 kg)    Fetal Status: Fetal Heart Rate (bpm): 145 Fundal Height: 27 cm Movement: Present     General:  Alert, oriented and cooperative. Patient is in no acute distress.  Skin: Skin is warm and dry. No rash noted.   Cardiovascular: Normal heart rate noted  Respiratory: Normal respiratory effort, no problems with respiration noted  Abdomen: Soft, gravid, appropriate for gestational age.  Pain/Pressure: Absent     Pelvic: Cervical exam deferred        Extremities: Normal range of motion.  Edema: None  Mental Status: Normal mood and affect. Normal behavior. Normal judgment and thought content.   Assessment and Plan:  Pregnancy: G3P1102 at [redacted]w[redacted]d   1. Encounter for supervision of normal first pregnancy in second trimester   2. History of placenta abruption   3. [redacted] weeks gestation of pregnancy     -Patient desires VBAC, will have patient had appt with MD to talk about VBAC, given history of successful VBAC, however, will let MD have more in-depth conversation.  -[place] reviewed; normal growth , FH normal today -2 hour GTT  next visit, plus visit with MD to talk about VBAC vs. C/section.  -declines genetic testing -encouraged prenatal vitamins, talked about low Vitamin D level.    There are no diagnoses linked to this encounter. Preterm labor symptoms and general obstetric precautions including but not limited to vaginal bleeding, contractions, leaking of fluid and fetal movement were reviewed in detail with the patient. Please refer to After Visit Summary for other counseling recommendations.   Return in about 2 weeks (around 07/11/2020), or In person with MD to discuss C/S vs. VBAC and do 2 hour GTT.  Future Appointments  Date Time Provider Department Center  07/15/2020  8:20 AM [facility]  07/15/2020  9:15 AM  Bing, MD [facility]    Marylene Land, CNM

## 2020-07-08 ENCOUNTER — Other Ambulatory Visit: Payer: Self-pay | Admitting: General Practice

## 2020-07-08 DIAGNOSIS — O0990 Supervision of high risk pregnancy, unspecified, unspecified trimester: Secondary | ICD-10-CM

## 2020-07-15 ENCOUNTER — Ambulatory Visit (INDEPENDENT_AMBULATORY_CARE_PROVIDER_SITE_OTHER): Payer: Medicaid Other | Admitting: Obstetrics and Gynecology

## 2020-07-15 ENCOUNTER — Other Ambulatory Visit: Payer: Self-pay

## 2020-07-15 ENCOUNTER — Other Ambulatory Visit: Payer: Medicaid Other

## 2020-07-15 VITALS — BP 120/82 | HR 90 | Wt 122.3 lb

## 2020-07-15 DIAGNOSIS — Z98891 History of uterine scar from previous surgery: Secondary | ICD-10-CM

## 2020-07-15 DIAGNOSIS — Z302 Encounter for sterilization: Secondary | ICD-10-CM | POA: Insufficient documentation

## 2020-07-15 DIAGNOSIS — Z87898 Personal history of other specified conditions: Secondary | ICD-10-CM

## 2020-07-15 DIAGNOSIS — O0990 Supervision of high risk pregnancy, unspecified, unspecified trimester: Secondary | ICD-10-CM

## 2020-07-15 DIAGNOSIS — R7989 Other specified abnormal findings of blood chemistry: Secondary | ICD-10-CM

## 2020-07-15 DIAGNOSIS — Z3A29 29 weeks gestation of pregnancy: Secondary | ICD-10-CM

## 2020-07-15 MED ORDER — VITAMIN D (ERGOCALCIFEROL) 1.25 MG (50000 UNIT) PO CAPS: 50000.0000 [IU] | ORAL_CAPSULE | ORAL | 0 refills | Status: AC

## 2020-07-15 MED ORDER — BLOOD PRESSURE KIT DEVI: 1.0000 | Freq: Once | 0 refills | Status: AC

## 2020-07-15 NOTE — Progress Notes (Signed)
Prenatal Visit Note Date: 07/15/2020 [facility]  Subjective:  Brandy Perez is a 24 y.o. [id number] at 26w4dbeing seen today for ongoing prenatal care.  She is currently monitored for the following issues for this high-risk pregnancy and has History of VBAC; Migraines; History of marijuana use; History of poor fetal growth; History of placenta abruption; Supervision of high risk pregnancy, antepartum; Late prenatal care; Low vitamin D level; and Request for sterilization on their problem list.  Patient reports no complaints.   Contractions: Irritability. Vag. Bleeding: None.  Movement: Present. Denies leaking of fluid.   The following portions of the patient's history were reviewed and updated as appropriate: allergies, current medications, past family history, past medical history, past social history, past surgical history and problem list. Problem list updated.  Objective:   Vitals:   07/15/20 0827  BP: 120/82  Pulse: 90  Weight: 122 lb 4.8 oz (55.5 kg)    Fetal Status: Fetal Heart Rate (bpm): 136 Fundal Height: 28 cm Movement: Present     General:  Alert, oriented and cooperative. Patient is in no acute distress.  Skin: Skin is warm and dry. No rash noted.   Cardiovascular: Normal heart rate noted  Respiratory: Normal respiratory effort, no problems with respiration noted  Abdomen: Soft, gravid, appropriate for gestational age. Pain/Pressure: Present     Pelvic:  Cervical exam deferred        Extremities: Normal range of motion.  Edema: None  Mental Status: Normal mood and affect. Normal behavior. Normal judgment and thought content.   Urinalysis:      Assessment and Plan:  Pregnancy: G3P1102 at 272w4d1. Supervision of high risk pregnancy, antepartum Routine care. 28wk labs today. BTL paper signed today.  - Blood Pressure Monitoring (BLOOD PRESSURE KIT) DEVI; 1 Device by Does not apply route once for 1 dose.  Dispense: 1 each; Refill: 0  2. [redacted]  weeks gestation of pregnancy  3. History of poor fetal growth Normal FH. Continue to follow  4. History of VBAC Forms signed today  5. Low vitamin D level Pt hadn't picked up pills. Re-sent in  6. Request for sterilization See above  Preterm labor symptoms and general obstetric precautions including but not limited to vaginal bleeding, contractions, leaking of fluid and fetal movement were reviewed in detail with the patient. Please refer to After Visit Summary for other counseling recommendations.  Return in about 2 weeks (around 07/29/2020) for in person, low risk, high risk.   PiAletha HalimMD

## 2020-07-15 NOTE — Patient Instructions (Signed)
AREA PEDIATRIC/FAMILY PRACTICE PHYSICIANS  [place] ([address]) . [facility] o Chambliss, MD; Eniola, MD; Hale, MD; Hensel, MD; McDiarmid, MD; McIntyer, MD; Neal, MD; Walden, MD o [address] [place], [place] [address] o [phone] o Mon-Fri 8:30-12:30, 1:30-5:00 o Providers come to see babies at [facility] o Accepting Medicaid . [facility] o Limited providers who accept newborns: Koirala, MD; Morrow, MD; Wolters, MD o [address], [place], [place] [address] o [phone] o Mon-Fri 8:00-5:30 o Babies seen by providers at [facility] o Does NOT accept Medicaid o Please call early in hospitalization for appointment (limited availability)  . [facility] o Mulberry, MD o [address] [place], [place] [address] o [phone] o Mon, Tue, Thur, Fri 8:30-5:00, Wed 10:00-7:00 (closed 1-2pm) o Babies seen by [facility] providers o Accepting Medicaid . [facility] o Rubin, MD o [address], [place], [place] [address] o [phone] o Mon-Fri 8:30-5:00, Sat 8:30-12:00 o Provider comes to see babies at [facility] o Accepting Medicaid o Must have been referred from current patients or contacted office prior to delivery . [facility] ([facility]) o Bashor, MD; Chandler, MD; Ettefagh, MD; Grant, MD; Lester, MD; McCormick, MD; McQueen, MD; Prose, MD; Simha, MD; Stanley, MD; Stryffeler, NP; Tebben, NP o [address], [place], [place] [address] o [phone] o Mon, Tue, Thur, Fri 8:30-5:30, Wed 9:30-5:30, Sat 8:30-12:30 o Babies seen by [facility] providers o Accepting Medicaid o Only accepting infants of first-time parents or siblings of current patients o Hospital discharge coordinator will make follow-up appointment . Jack Amos o [address],  [place], [place]  [address] o [phone]   Fax - [phone] . [facility] o [address], [place], [place]  [address] o Phone - [phone]   Fax - [phone] . Shilpa Gosrani o [address], [place], [place]  [address] o [phone]  [place] ([address]) . [facility] o Bates, MD; Brassfield, MD; Cooper, Cox, MD; MD; Davis, MD; Dovico, MD; Ettefaugh, MD; Little, MD; Lowe, MD; Keiffer, MD; Melvin, MD; Sumner, MD; Williams, MD o [address], [place], [place] [address] o [phone] o Mon-Fri 8:30-5:00 (extended evenings Mon-Thur as needed), Sat-Sun 10:00-1:00 o Providers come to see babies at [facility] o Accepting Medicaid for families of first-time babies and families with all children in the household age 3 and under. Must register with office prior to making appointment (M-F only). . [facility] o Henson, NP; Knapp, MD; Lalonde, MD; Tysinger, PA o [address] [place], [place] [address] o [phone] o Mon-Fri 8:00-5:00 o Babies seen by providers at [facility] o Does NOT accept Medicaid/Commercial Insurance Only . [facility] ([facility]  o Artis, MD; Barnes, MD; Bratton, MD; Coccaro, MD; Lockett Gardner, MD; Kramer, MD; Marshall, MD; Netherton, MD; Poleto, MD; Skinner, MD o [address] [place],  [address] o [phone] o Mon-Fri 8:30-5:30, Sat (Oct.-Mar.) 9:00-1:00 o Babies seen by providers at [facility] o Accepting Medicaid  [place] ([address]) . [facility] o Reid, MD; Warner, MD o [address], [place],  [address] o [phone] o Mon-Fri 8:30-5:00, Sat 8:30-12:00 o Providers come to see babies at [facility] o Does NOT accept Medicaid . [facility] o Becker, PA; Hagler, MD; Scifres, PA; Sun, MD; Swayne, MD o [address],  [place], [place] [address] o [phone] o Mon-Fri 8:00-5:00 o Babies seen by providers at [facility] o Does NOT accept Medicaid o Only accepting babies of parents who are patients o Please call early in hospitalization for appointment (limited availability) . [facility] o Clark, MD; Frye, MD; Kelleher, MD; Mack, NP; Miller, MD; O'Keller, MD; Patterson, NP; Pudlo, MD; Puzio, MD; Thomas, MD; Tucker, MD; Twiselton, MD o [address], [place], [place] [address] o [phone] o Mon-Fri 8:00-5:00, Sat 9:00-12:00 o Providers come to see babies at [facility] o Does NOT accept Medicaid  [place] ([address]) . [facility] o Limited providers accepting new patients: Brake, NP; Wharton, PA o [address], [place], [place] [address] o [phone] o Mon-Fri 8:00-5:00 o Babies seen by providers at [facility] o Does NOT accept Medicaid o Only accepting babies of parents who are patients o Please call early in hospitalization for appointment (limited availability) . [facility] o Gay, MD; Quinlan, MD o [address] [place], [place] [address] o [phone] (press [phone] to schedule appointment) o Mon-Fri 8:00-5:00 o Providers come to see babies at [facility] o Does NOT accept Medicaid . [facility] o Mazer, MD o [address] [place], [place] [address] o [phone] o Mon-Fri 8:30-5:00 (lunch 12:30-1:00), extended hours by appointment only Wed 5:00-6:30 o Babies seen by [facility] providers o Accepting Medicaid . [facility] o Banks, MD; Jordan, MD; Koberlein, MD o [address], [place], [place] [address] o [phone] o Mon-Fri 8:00-5:00 o Babies seen by [facility] providers o Does NOT accept Medicaid . [facility] o Parker, MD; Hunter, MD; Wallace, DO o [address] [place], [place]  [address] o [phone] o Mon-Fri 8:00-5:00 o Babies seen by [facility] providers o Does NOT accept Medicaid . [facility] o Brandon, PA; Brecken, PA; Christy, NP; Dees, MD; DeClaire, MD; DeWeese, MD; Hansen, NP; Mills, NP; Parrish, NP; Smoot, NP; Summer, MD; Vapne, MD o [address] [place], [place] [address] o [phone] o Mon-Fri 8:30-5:00, Sat 10:00-1:00 o Providers come to see babies at [facility] o Does NOT accept Medicaid o Free prenatal information session Tuesdays at 4:45pm . [facility] o Bouska, MD; Gordon, PA; Jeffery, PA; Weber, PA o [address] [place] [address] o [phone] o Mon-Fri 7:30-5:30 o Babies seen by [facility] providers . [facility] o [address], [place], [place]  [address] o [phone]   Fax - [phone]  [place] ([address] & [address]) . [facility] o [place]ese, [place] o [address] [place], [place] [address] o [phone] o Mon-Thur 8:00-6:00 o Providers come to see babies at [facility] o Accepting Medicaid . [facility] o Anderson, NP; Badger, MD; Beal, PA; Spencer, PA o [address] [place], [place] [address] o [phone] o Mon-Thur 7:30-7:30, Fri 7:30-4:30 o Babies seen by [facility] providers o Accepting Medicaid . [facility] o Agbuya, MD; Klett, NP; Romgoolam, MD o [address], [place], [place] [address] o [phone] o Mon-Fri 8:30-5:00, Sat 8:30-12:00 o Providers come to see babies at [facility] o Accepting Medicaid o Must have "Meet & Greet" appointment at office prior to delivery . [facility] o McCord,   MD; Wallace, MD; Wood, MD o [address], [place], [place] [address] o [phone] o Mon-Wed 8:00-6:00, Thur-Fri 8:00-5:00, Sat 9:00-12:00 o Providers come to  see babies at [facility] o Does NOT accept Medicaid o Only accepting siblings of current patients . [facility]  o [address], [place], [place]  [address] o [phone]   Fax - [phone] . [facility] o [address], [place], [place]  [address] o [phone]   Fax - [phone]  [place] ([address] & [address]) .  [facility] o Cirigliano, DO; Matthews, DO o [address] [place], [place] [address] o [phone] o Mon-Fri 7:00-5:00 o Babies seen by [facility] providers o Does NOT accept Medicaid . [facility] o Briscoe, MD; Howley, PA; Moreira, PA o [address], [place], [place] [address] o [phone] o Mon-Fri 8:00-5:00 o Babies seen by [facility] providers o Accepting Medicaid . [facility] o Boyd, MD; Church, PA; Jones, NP; Osborn, PA o [address], ,  [address] o [phone] o Mon-Fri 8:00-5:00 o Babies seen by providers at [facility] o Accepting Medicaid   

## 2020-07-16 LAB — CBC
Hematocrit: 30.5 % — ABNORMAL LOW (ref 34.0–46.6)
Hemoglobin: 10.7 g/dL — ABNORMAL LOW (ref 11.1–15.9)
MCH: 29.6 pg (ref 26.6–33.0)
MCHC: 35.1 g/dL (ref 31.5–35.7)
MCV: 84 fL (ref 79–97)
Platelets: 235 10*3/uL (ref 150–450)
RBC: 3.62 x10E6/uL — ABNORMAL LOW (ref 3.77–5.28)
RDW: 12.4 % (ref 11.7–15.4)
WBC: 11.3 10*3/uL — ABNORMAL HIGH (ref 3.4–10.8)

## 2020-07-16 LAB — GLUCOSE TOLERANCE, 2 HOURS W/ 1HR
Glucose, 1 hour: 84 mg/dL (ref 65–179)
Glucose, 2 hour: 83 mg/dL (ref 65–152)
Glucose, Fasting: 72 mg/dL (ref 65–91)

## 2020-07-16 LAB — HIV ANTIBODY (ROUTINE TESTING W REFLEX): HIV Screen 4th Generation wRfx: NONREACTIVE

## 2020-07-16 LAB — RPR: RPR Ser Ql: NONREACTIVE

## 2020-07-18 ENCOUNTER — Encounter: Payer: Self-pay | Admitting: *Deleted

## 2020-07-19 MED ORDER — FERROUS GLUCONATE 324 (38 FE) MG PO TABS
324.0000 mg | ORAL_TABLET | Freq: Every day | ORAL | 1 refills | Status: DC
Start: 2020-07-19 — End: 2024-01-21

## 2020-07-26 ENCOUNTER — Encounter: Payer: Self-pay | Admitting: General Practice

## 2020-08-04 ENCOUNTER — Ambulatory Visit (INDEPENDENT_AMBULATORY_CARE_PROVIDER_SITE_OTHER): Payer: Medicaid Other | Admitting: Obstetrics and Gynecology

## 2020-08-04 ENCOUNTER — Other Ambulatory Visit: Payer: Self-pay

## 2020-08-04 VITALS — BP 131/83 | HR 88 | Wt 124.3 lb

## 2020-08-04 DIAGNOSIS — Z3A32 32 weeks gestation of pregnancy: Secondary | ICD-10-CM

## 2020-08-04 DIAGNOSIS — Z98891 History of uterine scar from previous surgery: Secondary | ICD-10-CM

## 2020-08-04 NOTE — Progress Notes (Signed)
Prenatal Visit Note Date: 08/04/2020 Clinic: [facility]  Subjective:  Brandy Perez is a 24 y.o. [id number] at [redacted]w[redacted]d being seen today for ongoing prenatal care.  She is currently monitored for the following issues for this low-risk pregnancy and has History of VBAC; Migraines; History of marijuana use; History of poor fetal growth; History of placenta abruption; Supervision of high risk pregnancy, antepartum; Late prenatal care; Low vitamin D level; and Request for sterilization on their problem list.  Patient reports lower belly discomfort.   Contractions: Irritability. Vag. Bleeding: None.  Movement: Present. Denies leaking of fluid.   The following portions of the patient's history were reviewed and updated as appropriate: allergies, current medications, past family history, past medical history, past social history, past surgical history and problem list. Problem list updated.  Objective:   Vitals:   08/04/20 1405  BP: 131/83  Pulse: 88  Weight: 124 lb 4.8 oz (56.4 kg)    Fetal Status: Fetal Heart Rate (bpm): 142 Fundal Height: 31 cm Movement: Present     General:  Alert, oriented and cooperative. Patient is in no acute distress.  Skin: Skin is warm and dry. No rash noted.   Cardiovascular: Normal heart rate noted  Respiratory: Normal respiratory effort, no problems with respiration noted  Abdomen: Soft, gravid, appropriate for gestational age. Pain/Pressure: Present     Pelvic:  Cervical exam deferred        Extremities: Normal range of motion.  Edema: None  Mental Status: Normal mood and affect. Normal behavior. Normal judgment and thought content.   Urinalysis:      Assessment and Plan:  Pregnancy: G3P1102 at [redacted]w[redacted]d  1. History of VBAC Desires tolac. Papers already signed, in addition to btl papers  2. [redacted] weeks gestation of pregnancy Patient states she needs to pick up iron  Preterm labor symptoms and general obstetric precautions including but not  limited to vaginal bleeding, contractions, leaking of fluid and fetal movement were reviewed in detail with the patient. Please refer to After Visit Summary for other counseling recommendations.  Return in about 2 weeks (around 08/18/2020) for md or app, in person.   Essex Bing, MD

## 2020-08-18 ENCOUNTER — Encounter: Payer: Self-pay | Admitting: Obstetrics and Gynecology

## 2020-08-18 ENCOUNTER — Ambulatory Visit (INDEPENDENT_AMBULATORY_CARE_PROVIDER_SITE_OTHER): Payer: Medicaid Other | Admitting: Obstetrics and Gynecology

## 2020-08-18 ENCOUNTER — Other Ambulatory Visit (HOSPITAL_COMMUNITY)
Admission: RE | Admit: 2020-08-18 | Discharge: 2020-08-18 | Disposition: A | Discharge status: Home / Self Care | Payer: Medicaid Other | Source: Ambulatory Visit | Attending: Obstetrics and Gynecology | Admitting: Obstetrics and Gynecology

## 2020-08-18 ENCOUNTER — Other Ambulatory Visit: Payer: Self-pay

## 2020-08-18 VITALS — BP 138/93 | HR 95 | Wt 125.4 lb

## 2020-08-18 DIAGNOSIS — Z98891 History of uterine scar from previous surgery: Secondary | ICD-10-CM

## 2020-08-18 DIAGNOSIS — O0990 Supervision of high risk pregnancy, unspecified, unspecified trimester: Secondary | ICD-10-CM | POA: Diagnosis not present

## 2020-08-18 DIAGNOSIS — Z302 Encounter for sterilization: Secondary | ICD-10-CM

## 2020-08-18 DIAGNOSIS — R7989 Other specified abnormal findings of blood chemistry: Secondary | ICD-10-CM

## 2020-08-18 NOTE — Progress Notes (Signed)
° °  PRENATAL VISIT NOTE  Subjective:  Brandy Perez is a 24 y.o. G3P1102 at [redacted]w[redacted]d being seen today for ongoing prenatal care.  She is currently monitored for the following issues for this low-risk pregnancy and has History of VBAC; Migraines; History of marijuana use; History of poor fetal growth; History of placenta abruption; Supervision of high risk pregnancy, antepartum; Late prenatal care; Low vitamin D level; and Request for sterilization on their problem list.  Patient reports history of migraine which she does not treat with medication. Patient also reports a pruritic discharge.  Contractions: Irritability. Vag. Bleeding: None.  Movement: Present. Denies leaking of fluid.   The following portions of the patient's history were reviewed and updated as appropriate: allergies, current medications, past family history, past medical history, past social history, past surgical history and problem list.   Objective:   Vitals:   08/18/20 1406  BP: (!) 138/93  Pulse: 95  Weight: 125 lb 6.4 oz (56.9 kg)    Fetal Status: Fetal Heart Rate (bpm): 141 Fundal Height: 33 cm Movement: Present     General:  Alert, oriented and cooperative. Patient is in no acute distress.  Skin: Skin is warm and dry. No rash noted.   Cardiovascular: Normal heart rate noted  Respiratory: Normal respiratory effort, no problems with respiration noted  Abdomen: Soft, gravid, appropriate for gestational age.  Pain/Pressure: Present     Pelvic: Cervical exam deferred        Extremities: Normal range of motion.  Edema: Trace  Mental Status: Normal mood and affect. Normal behavior. Normal judgment and thought content.   Assessment and Plan:  Pregnancy: G3P1102 at [redacted]w[redacted]d 1. Low vitamin D level   2. Supervision of high risk pregnancy, antepartum Patient is doing well  Vaginal swab today GBS next visit Patient with elevated BP today. In the setting of HA, will obtain labs.  Patient is in a rush as she needs to pick up  daughter at 2:30pm   3. History of VBAC Desires TOLAC  4. Request for sterilization Papers previously signed  Preterm labor symptoms and general obstetric precautions including but not limited to vaginal bleeding, contractions, leaking of fluid and fetal movement were reviewed in detail with the patient. Please refer to After Visit Summary for other counseling recommendations.   No follow-ups on file.  No future appointments.  Catalina Antigua, MD

## 2020-08-18 NOTE — Progress Notes (Signed)
Pt reports "seeing starts on and off" during pregnancy. Reports migraine headache on right side of head Sunday night- Monday morning. Denies any symptoms at this time.   Fleet Contras RN 08/18/2194

## 2020-08-19 ENCOUNTER — Other Ambulatory Visit: Payer: Self-pay

## 2020-08-19 ENCOUNTER — Encounter (HOSPITAL_COMMUNITY): Payer: Self-pay | Admitting: Obstetrics and Gynecology

## 2020-08-19 ENCOUNTER — Inpatient Hospital Stay (HOSPITAL_COMMUNITY)
Admission: AD | Admit: 2020-08-19 | Discharge: 2020-08-19 | Disposition: A | Discharge status: Home Health | Payer: Medicaid Other | Attending: Obstetrics and Gynecology | Admitting: Obstetrics and Gynecology

## 2020-08-19 ENCOUNTER — Ambulatory Visit (INDEPENDENT_AMBULATORY_CARE_PROVIDER_SITE_OTHER): Payer: Medicaid Other

## 2020-08-19 VITALS — BP 131/78 | HR 88 | Wt 125.4 lb

## 2020-08-19 DIAGNOSIS — O26893 Other specified pregnancy related conditions, third trimester: Secondary | ICD-10-CM

## 2020-08-19 DIAGNOSIS — D649 Anemia, unspecified: Secondary | ICD-10-CM | POA: Diagnosis not present

## 2020-08-19 DIAGNOSIS — R109 Unspecified abdominal pain: Secondary | ICD-10-CM | POA: Diagnosis not present

## 2020-08-19 DIAGNOSIS — Z0130 Encounter for examination of blood pressure without abnormal findings: Secondary | ICD-10-CM

## 2020-08-19 DIAGNOSIS — O4703 False labor before 37 completed weeks of gestation, third trimester: Secondary | ICD-10-CM

## 2020-08-19 DIAGNOSIS — G44209 Tension-type headache, unspecified, not intractable: Secondary | ICD-10-CM

## 2020-08-19 DIAGNOSIS — Z3A38 38 weeks gestation of pregnancy: Secondary | ICD-10-CM

## 2020-08-19 DIAGNOSIS — Z3A34 34 weeks gestation of pregnancy: Secondary | ICD-10-CM

## 2020-08-19 DIAGNOSIS — O99353 Diseases of the nervous system complicating pregnancy, third trimester: Secondary | ICD-10-CM | POA: Diagnosis not present

## 2020-08-19 DIAGNOSIS — Z3689 Encounter for other specified antenatal screening: Secondary | ICD-10-CM

## 2020-08-19 DIAGNOSIS — O99013 Anemia complicating pregnancy, third trimester: Secondary | ICD-10-CM

## 2020-08-19 DIAGNOSIS — R519 Headache, unspecified: Secondary | ICD-10-CM

## 2020-08-19 LAB — URINALYSIS, ROUTINE W REFLEX MICROSCOPIC
Bilirubin Urine: NEGATIVE
Glucose, UA: NEGATIVE mg/dL
Hgb urine dipstick: NEGATIVE
Ketones, ur: NEGATIVE mg/dL
Nitrite: NEGATIVE
Protein, ur: NEGATIVE mg/dL
Specific Gravity, Urine: 1.015 (ref 1.005–1.030)
pH: 6 (ref 5.0–8.0)

## 2020-08-19 LAB — CERVICOVAGINAL ANCILLARY ONLY
Bacterial Vaginitis (gardnerella): POSITIVE — AB
Candida Glabrata: NEGATIVE
Candida Vaginitis: POSITIVE — AB
Chlamydia: NEGATIVE
Comment: NEGATIVE
Comment: NEGATIVE
Comment: NEGATIVE
Comment: NEGATIVE
Comment: NEGATIVE
Comment: NORMAL
Neisseria Gonorrhea: NEGATIVE
Trichomonas: NEGATIVE

## 2020-08-19 LAB — CBC
Hematocrit: 28.9 % — ABNORMAL LOW (ref 34.0–46.6)
Hemoglobin: 9.7 g/dL — ABNORMAL LOW (ref 11.1–15.9)
MCH: 26.4 pg — ABNORMAL LOW (ref 26.6–33.0)
MCHC: 33.6 g/dL (ref 31.5–35.7)
MCV: 79 fL (ref 79–97)
Platelets: 252 10*3/uL (ref 150–450)
RBC: 3.67 x10E6/uL — ABNORMAL LOW (ref 3.77–5.28)
RDW: 13 % (ref 11.7–15.4)
WBC: 11.8 10*3/uL — ABNORMAL HIGH (ref 3.4–10.8)

## 2020-08-19 LAB — COMPREHENSIVE METABOLIC PANEL
ALT: 8 IU/L (ref 0–32)
AST: 15 IU/L (ref 0–40)
Albumin/Globulin Ratio: 1.1 — ABNORMAL LOW (ref 1.2–2.2)
Albumin: 3.5 g/dL — ABNORMAL LOW (ref 3.9–5.0)
Alkaline Phosphatase: 162 IU/L — ABNORMAL HIGH (ref 44–121)
BUN/Creatinine Ratio: 11 (ref 9–23)
BUN: 6 mg/dL (ref 6–20)
Bilirubin Total: 0.4 mg/dL (ref 0.0–1.2)
CO2: 20 mmol/L (ref 20–29)
Calcium: 9.2 mg/dL (ref 8.7–10.2)
Chloride: 102 mmol/L (ref 96–106)
Creatinine, Ser: 0.53 mg/dL — ABNORMAL LOW (ref 0.57–1.00)
GFR calc Af Amer: 152 mL/min/{1.73_m2} (ref >59)
GFR calc non Af Amer: 131 mL/min/{1.73_m2} (ref >59)
Globulin, Total: 3.2 g/dL (ref 1.5–4.5)
Glucose: 93 mg/dL (ref 65–99)
Potassium: 3.5 mmol/L (ref 3.5–5.2)
Sodium: 138 mmol/L (ref 134–144)
Total Protein: 6.7 g/dL (ref 6.0–8.5)

## 2020-08-19 LAB — URINALYSIS, MICROSCOPIC (REFLEX)

## 2020-08-19 MED ORDER — NIFEDIPINE 10 MG PO CAPS
10.0000 mg | ORAL_CAPSULE | ORAL | Status: DC | PRN
  Administered 2020-08-19: 10 mg via ORAL
  Filled 2020-08-19: qty 1

## 2020-08-19 MED ORDER — ACETAMINOPHEN 325 MG PO TABS
650.0000 mg | ORAL_TABLET | Freq: Once | ORAL | Status: AC
  Administered 2020-08-19: 650 mg via ORAL
  Filled 2020-08-19: qty 2

## 2020-08-19 NOTE — MAU Provider Note (Addendum)
Patient Brandy Perez is a 24 y.o. G3P1102  at  [redacted]w[redacted]d here with complaints of contractions, headache and "being tired of being pregnant". She denies LOF, decreased fetal movements, vaginal bleeding. She started having a headache yesterday and some cramping last night.   She had a prenatal visit yesterday and had pre-e labs drawn due to elevated BP and HA. History     CSN: [id number]  Arrival date and time: 08/19/20 1654   First Provider Initiated Contact with Patient 08/19/20 1909      Chief Complaint  Patient presents with  . Headache  . Abdominal Cramping   Abdominal Pain This is a new problem. The current episode started yesterday. The problem occurs intermittently. The pain is located in the suprapubic region. The pain is at a severity of 6/10. The quality of the pain is cramping. The abdominal pain does not radiate. Associated symptoms include headaches. Pertinent negatives include no constipation, diarrhea or dysuria.  Headache  This is a new problem. The current episode started today. The problem occurs constantly. The pain is located in the frontal region. The pain is at a severity of 6/10. Associated symptoms include abdominal pain. Nothing aggravates the symptoms. She has tried acetaminophen for the symptoms.    OB History     Gravida  3   Para  2   Term  1   Preterm  1   AB      Living  2      SAB      TAB      Ectopic      Multiple      Live Births  2           Past Medical History:  Diagnosis Date  . Medical history non-contributory     Past Surgical History:  Procedure Laterality Date  . CESAREAN SECTION    . WISDOM TOOTH EXTRACTION      History reviewed. No pertinent family history.  Social History   Tobacco Use  . Smoking status: Never Smoker  . Smokeless tobacco: Never Used  Vaping Use  . Vaping Use: Never used  Substance Use Topics  . Alcohol use: Not Currently  . Drug use: Yes    Types: Marijuana    Comment: last used  06/2020    Allergies: No Known Allergies  Medications Prior to Admission  Medication Sig Dispense Refill Last Dose  . acetaminophen (TYLENOL 8 HOUR) 650 MG CR tablet Take 1 tablet (650 mg total) by mouth every 8 (eight) hours as needed for pain. 15 tablet 0 08/18/2020 at Unknown time  . Prenatal MV-Min-FA-Omega-3 (PRENATAL GUMMIES/DHA & FA) 0.4-32.5 MG CHEW Chew 1 Dose by mouth daily. 60 tablet 2 08/19/2020 at Unknown time  . ferrous gluconate (FERGON) 324 MG tablet Take 1 tablet (324 mg total) by mouth daily with breakfast. (Patient not taking: Reported on 08/04/2020) 60 tablet 1   . Vitamin D, Ergocalciferol, (DRISDOL) 1.25 MG (50000 UNIT) CAPS capsule Take 1 capsule (50,000 Units total) by mouth every 7 (seven) days for 12 doses. (Patient not taking: Reported on 08/04/2020) 12 capsule 0     Review of Systems  Gastrointestinal: Positive for abdominal pain. Negative for constipation and diarrhea.  Genitourinary: Negative for dysuria.  Neurological: Positive for headaches.   Physical Exam   Blood pressure 131/71, pulse 69, temperature 98.2 F (36.8 C), temperature source Oral, resp. rate 17, height 5\' 5"  (1.651 m), weight 57.3 kg, SpO2 99 %.  Physical Exam Constitutional:  Appearance: She is well-developed.  HENT:     Head: Normocephalic.  Eyes:     Extraocular Movements: Extraocular movements intact.  Pulmonary:     Effort: Pulmonary effort is normal.  Abdominal:     Palpations: Abdomen is soft.  Neurological:     Mental Status: She is alert.    Cervix is FT, posterior, long.   MAU Course  Procedures  MDM -NST: 145 bpm, mod var, present acel, occasional variables with contractions but not repetitive. Variables increasing with frequency at 1900.   -UA-needs to be collected   -review of labs shows that patient is anemic, her H/H is 9.7/28.9, which is a decrease from 10.7/30.5.No signs of pre-e based on CBC and CMP, although PCR still pending.   -Patient recheck of  cervix and is still the same (FT, long, posterior).  -Patient still reporting cramping and contractions, will offer PO hydration and tylenol plus one dose of procardia.     Patient care endorsed to [place], [place] at 2015  Assessment and Plan    Charlesetta Garibaldi [facility] 08/19/2020, 7:50 PM   Reassessment (10:03 PM) -Nurse reports patient ready for reassessment and provider to bedside. -Patient reports improvement in symptoms and requests discharge. -Patient without questions or concerns. -Encouraged to call or return to MAU if symptoms worsen or with the onset of new symptoms. -Discharged to home in improved condition.  Cherre Robins MSN, CNM Advanced Practice Provider, [facility]

## 2020-08-19 NOTE — Discharge Instructions (Signed)

## 2020-08-19 NOTE — MAU Note (Signed)
Pt reports her uterus feels more relaxed. Cramping has improved.

## 2020-08-19 NOTE — MAU Note (Signed)
External fetal and labor monitors reapplied-pt had removed-thought she was being discharged

## 2020-08-19 NOTE — MAU Note (Signed)
Pt stated headache started last night-went to doctors this morning. Told MD tylenol not effective-took nap and when she awoke headache continued. Reports uterine cramping started last night as well intermittently.

## 2020-08-19 NOTE — MAU Note (Signed)
Presents with c/o H/a, took Tylenol @ 1200 this afternoon, no relief.  Also reports lower abdominal cramping that began last night.  Denies VB. States went to [facility] today for BP eval secondary BP elevated when taken [facility].  Reports "they didn't do nothing, just said take Tylenol and it was a waste."

## 2020-08-19 NOTE — Progress Notes (Signed)
Pt here today with c/o severe headache.  Pt states that she has a severe headache that she has not taken any pain medication for.  Pt reports that she went to [facility] and checked her blood pressure it was 127/90.  Pt informs me that she had elevated blood pressure in her visit yesterday.  PE labs drawn yesterday.  BP LA 131/78.  I asked pt if she has a blood pressure cuff at home.  Pt states that she does not.  Per chart review, blood pressure cuff e-prescribed on 07/15/20.  Pt advised to go to [facility] to pick up cuff, provided pt with pharmacy info.  Pt also advised to go to home to take Tylenol 1000 mg and apply a cool compress to see if that helps with her headache.  I encouraged to continue to monitor for sx's of elevated BP and to check her BP at home if it's 140/90 to please call the office.  Pt verbalized understanding with no further questions.   Addison Naegeli, RN  08/19/20

## 2020-08-19 NOTE — MAU Note (Signed)
Uterus is soft and non tender to palpation. Toco placement checked-pt denies SROM, vaginal bleeding or bloody show.

## 2020-08-19 NOTE — MAU Note (Signed)
Pt up to bathroom-urine sample collected

## 2020-08-19 NOTE — MAU Note (Signed)
Pt states she was unable to eat sandwich tray provided. Graham crackers and peanut butter provided with juice.

## 2020-08-19 NOTE — MAU Note (Signed)
Questionable inverted contractions with early decelerations -RN at bedside for additional evaluation. Pt states she is unsure if she is contracting. Pt repositioned - uterus palpated

## 2020-08-20 NOTE — Progress Notes (Signed)
Agree with A & P. 

## 2020-08-22 MED ORDER — METRONIDAZOLE 500 MG PO TABS: 500.0000 mg | ORAL_TABLET | Freq: Two times a day (BID) | ORAL | 0 refills | Status: DC

## 2020-08-22 MED ORDER — FLUCONAZOLE 150 MG PO TABS: 150.0000 mg | ORAL_TABLET | Freq: Once | ORAL | 0 refills | Status: AC

## 2020-08-22 NOTE — Addendum Note (Signed)
Addended by: Catalina Antigua on: 08/22/2020 08:58 AM   Modules accepted: Orders

## 2020-08-30 ENCOUNTER — Inpatient Hospital Stay (HOSPITAL_COMMUNITY)
Admission: AD | Admit: 2020-08-30 | Discharge: 2020-08-31 | Disposition: A | Discharge status: Home / Self Care | Payer: Medicaid Other | Attending: Obstetrics & Gynecology | Admitting: Obstetrics & Gynecology

## 2020-08-30 ENCOUNTER — Other Ambulatory Visit: Payer: Self-pay

## 2020-08-30 ENCOUNTER — Encounter (HOSPITAL_COMMUNITY): Payer: Self-pay | Admitting: Obstetrics & Gynecology

## 2020-08-30 DIAGNOSIS — Z3A36 36 weeks gestation of pregnancy: Secondary | ICD-10-CM

## 2020-08-30 DIAGNOSIS — Z3689 Encounter for other specified antenatal screening: Secondary | ICD-10-CM

## 2020-08-30 DIAGNOSIS — B373 Candidiasis of vulva and vagina: Secondary | ICD-10-CM

## 2020-08-30 DIAGNOSIS — Z0371 Encounter for suspected problem with amniotic cavity and membrane ruled out: Secondary | ICD-10-CM | POA: Insufficient documentation

## 2020-08-30 DIAGNOSIS — O26893 Other specified pregnancy related conditions, third trimester: Secondary | ICD-10-CM | POA: Diagnosis present

## 2020-08-30 DIAGNOSIS — B3731 Acute candidiasis of vulva and vagina: Secondary | ICD-10-CM

## 2020-08-30 DIAGNOSIS — O133 Gestational [pregnancy-induced] hypertension without significant proteinuria, third trimester: Secondary | ICD-10-CM | POA: Insufficient documentation

## 2020-08-30 DIAGNOSIS — O479 False labor, unspecified: Secondary | ICD-10-CM

## 2020-08-30 LAB — URINALYSIS, ROUTINE W REFLEX MICROSCOPIC
Bilirubin Urine: NEGATIVE
Glucose, UA: NEGATIVE mg/dL
Hgb urine dipstick: NEGATIVE
Ketones, ur: NEGATIVE mg/dL
Nitrite: NEGATIVE
Protein, ur: NEGATIVE mg/dL
Specific Gravity, Urine: 1.023 (ref 1.005–1.030)
pH: 5 (ref 5.0–8.0)

## 2020-08-30 LAB — CBC
HCT: 29.5 % — ABNORMAL LOW (ref 36.0–46.0)
Hemoglobin: 9.7 g/dL — ABNORMAL LOW (ref 12.0–15.0)
MCH: 25.3 pg — ABNORMAL LOW (ref 26.0–34.0)
MCHC: 32.9 g/dL (ref 30.0–36.0)
MCV: 76.8 fL — ABNORMAL LOW (ref 80.0–100.0)
Platelets: 235 10*3/uL (ref 150–400)
RBC: 3.84 MIL/uL — ABNORMAL LOW (ref 3.87–5.11)
RDW: 13.9 % (ref 11.5–15.5)
WBC: 12.2 10*3/uL — ABNORMAL HIGH (ref 4.0–10.5)
nRBC: 0 % (ref 0.0–0.2)

## 2020-08-30 LAB — WET PREP, GENITAL
Clue Cells Wet Prep HPF POC: NONE SEEN
Sperm: NONE SEEN
Trich, Wet Prep: NONE SEEN

## 2020-08-30 LAB — POCT FERN TEST: POCT Fern Test: NEGATIVE

## 2020-08-30 NOTE — MAU Provider Note (Signed)
First Provider Initiated Contact with Patient 08/30/20 2221     S: [name] is a 24 y.o. [id number] at [redacted]w[redacted]d  who presents to MAU today complaining of leaking of fluid since around 2100. She reports standing in her kitchen and having a gush of fluid. Denies having to wear pad or panty liner since possible rupture. She denies vaginal bleeding. She denies contractions- patient reports she has been contracting frequently for the past "few" days. She reports normal fetal movement.    Upon arrival to MAU elevated blood pressures noted. Patient denies hx of HTN - according to chart reviewed elevated BP 2 weeks ago in the office on 11/18 of 138/93. Patient denies HA, reports occasional vision changes (blurred vision)- denies blurred vision currently. Denies epigastric pain or RUQ pain.   Chief Complaint  Patient presents with  . Rupture of Membranes   O:  Patient Vitals for the past 24 hrs:  BP Temp Temp src Pulse Resp Height Weight  08/31/20 0031 (!) 139/102 -- -- 86 -- -- --  08/31/20 0016 123/77 -- -- 74 -- -- --  08/31/20 0001 129/87 -- -- 80 -- -- --  08/30/20 2346 121/88 -- -- 83 -- -- --  08/30/20 2331 (!) 131/100 -- -- 85 -- -- --  08/30/20 2316 139/89 -- -- 87 -- -- --  08/30/20 2301 (!) 144/96 -- -- 86 -- -- --  08/30/20 2246 (!) 143/85 -- -- 82 -- -- --  08/30/20 2231 (!) 141/90 -- -- 78 -- -- --  08/30/20 2216 (!) 132/91 -- -- 89 -- -- --  08/30/20 2203 (!) 145/93 -- -- 89 -- -- --  08/30/20 2143 (!) 136/92 98.3 F (36.8 C) Oral 93 20 5\' 5"  (1.651 m) 56.2 kg   GENERAL: Well-developed, well-nourished female in no acute distress.  HEAD: Normocephalic, atraumatic.  CHEST: Normal effort of breathing, regular heart rate ABDOMEN: Soft, nontender, gravid PELVIC: Normal external female genitalia. Vagina is pink and rugated. Cervix with normal contour, no lesions. Moderate amount of white thin discharge without odor.  Negative pooling.  EXTREMITIES: No pedal edema  NEURO: 2+ deep  tendon reflexes, No clonus  Cervical exam:  Dilation: 3 Effacement (%): 70 Cervical Position: Posterior Station: -2 Presentation: Vertex Exam by:: [email or web address] CNM  Fetal Monitoring: Baseline: 135 Variability: moderate Accelerations: present  Decelerations: none Contractions: 5-7  Results for orders placed or performed during the hospital encounter of 08/30/20 (from the past 24 hour(s))  Protein / creatinine ratio, urine     Status: None   Collection Time: 08/30/20 10:21 PM  Result Value Ref Range   Creatinine, Urine 200.66 mg/dL   Total Protein, Urine 18 mg/dL   Protein Creatinine Ratio 0.09 0.00 - 0.15 mg/mg[Cre]  Urinalysis, Routine w reflex microscopic Urine, Clean Catch     Status: Abnormal   Collection Time: 08/30/20 10:22 PM  Result Value Ref Range   Color, Urine YELLOW YELLOW   APPearance CLOUDY (A) CLEAR   Specific Gravity, Urine 1.023 1.005 - 1.030   pH 5.0 5.0 - 8.0   Glucose, UA NEGATIVE NEGATIVE mg/dL   Hgb urine dipstick NEGATIVE NEGATIVE   Bilirubin Urine NEGATIVE NEGATIVE   Ketones, ur NEGATIVE NEGATIVE mg/dL   Protein, ur NEGATIVE NEGATIVE mg/dL   Nitrite NEGATIVE NEGATIVE   Leukocytes,Ua LARGE (A) NEGATIVE   RBC / HPF 6-10 0 - 5 RBC/hpf   WBC, UA 11-20 0 - 5 WBC/hpf   Bacteria, UA FEW (A) NONE SEEN  Squamous Epithelial / LPF 21-50 0 - 5   Mucus PRESENT   CBC     Status: Abnormal   Collection Time: 08/30/20 10:29 PM  Result Value Ref Range   WBC 12.2 (H) 4.0 - 10.5 K/uL   RBC 3.84 (L) 3.87 - 5.11 MIL/uL   Hemoglobin 9.7 (L) 12.0 - 15.0 g/dL   HCT 65.7 (L) 36 - 46 %   MCV 76.8 (L) 80.0 - 100.0 fL   MCH 25.3 (L) 26.0 - 34.0 pg   MCHC 32.9 30.0 - 36.0 g/dL   RDW 84.6 96.2 - 95.2 %   Platelets 235 150 - 400 K/uL   nRBC 0.0 0.0 - 0.2 %  Comprehensive metabolic panel     Status: Abnormal   Collection Time: 08/30/20 10:29 PM  Result Value Ref Range   Sodium 136 135 - 145 mmol/L   Potassium 3.6 3.5 - 5.1 mmol/L   Chloride 105 98 - 111 mmol/L   CO2  19 (L) 22 - 32 mmol/L   Glucose, Bld 73 70 - 99 mg/dL   BUN 6 6 - 20 mg/dL   Creatinine, Ser 8.41 0.44 - 1.00 mg/dL   Calcium 9.4 8.9 - 32.4 mg/dL   Total Protein 6.4 (L) 6.5 - 8.1 g/dL   Albumin 2.7 (L) 3.5 - 5.0 g/dL   AST 20 15 - 41 U/L   ALT 10 0 - 44 U/L   Alkaline Phosphatase 172 (H) 38 - 126 U/L   Total Bilirubin 0.5 0.3 - 1.2 mg/dL   GFR, Estimated >40 >10 mL/min   Anion gap 12 5 - 15  Wet prep, genital     Status: Abnormal   Collection Time: 08/30/20 10:29 PM   Specimen: Vaginal  Result Value Ref Range   Yeast Wet Prep HPF POC PRESENT (A) NONE SEEN   Trich, Wet Prep NONE SEEN NONE SEEN   Clue Cells Wet Prep HPF POC NONE SEEN NONE SEEN   WBC, Wet Prep HPF POC MANY (A) NONE SEEN   Sperm NONE SEEN   POCT fern test     Status: None   Collection Time: 08/30/20 10:40 PM  Result Value Ref Range   POCT Fern Test Negative = intact amniotic membranes   PEC labs normal  Yeast positive on wet prep- Rx for diflucan x1 dose sent to pharmacy of choice   A: [redacted]w[redacted]d week IUP Gestational hypertension FHR reactive SIUP at [redacted]w[redacted]d  Membranes intact  P: Discharge home in stable condition per consult with Malachy Chamber, MD Preeclampsia precautions. Follow-up for blood pressure check and prenatal appointment tomorrow Return to maternity admissions as needed in emergencies or for labor evaluation IOL scheduled for 12/6 for GHTN - orders for admission placed    Sharyon Cable, CNM 08/31/2020 12:55 AM

## 2020-08-30 NOTE — MAU Note (Signed)
PT SAYS HAS BEEN UC ALL DAY . THEN SROM AT 9PM-  CLEAR FLUID. PNC WITH CLINIC-  NO VE IN OFFICE .  DENIES HSV  AND MRSA. GBS- NOT COLLECTED YET.

## 2020-08-31 ENCOUNTER — Telehealth (HOSPITAL_COMMUNITY): Payer: Self-pay | Admitting: *Deleted

## 2020-08-31 ENCOUNTER — Other Ambulatory Visit: Payer: Self-pay | Admitting: Certified Nurse Midwife

## 2020-08-31 LAB — COMPREHENSIVE METABOLIC PANEL
ALT: 10 U/L (ref 0–44)
AST: 20 U/L (ref 15–41)
Albumin: 2.7 g/dL — ABNORMAL LOW (ref 3.5–5.0)
Alkaline Phosphatase: 172 U/L — ABNORMAL HIGH (ref 38–126)
Anion gap: 12 (ref 5–15)
BUN: 6 mg/dL (ref 6–20)
CO2: 19 mmol/L — ABNORMAL LOW (ref 22–32)
Calcium: 9.4 mg/dL (ref 8.9–10.3)
Chloride: 105 mmol/L (ref 98–111)
Creatinine, Ser: 0.5 mg/dL (ref 0.44–1.00)
GFR, Estimated: >60 mL/min (ref >60)
Glucose, Bld: 73 mg/dL (ref 70–99)
Potassium: 3.6 mmol/L (ref 3.5–5.1)
Sodium: 136 mmol/L (ref 135–145)
Total Bilirubin: 0.5 mg/dL (ref 0.3–1.2)
Total Protein: 6.4 g/dL — ABNORMAL LOW (ref 6.5–8.1)

## 2020-08-31 LAB — PROTEIN / CREATININE RATIO, URINE
Creatinine, Urine: 200.66 mg/dL
Protein Creatinine Ratio: 0.09 mg/mg{Cre} (ref 0.00–0.15)
Total Protein, Urine: 18 mg/dL

## 2020-08-31 MED ORDER — FLUCONAZOLE 150 MG PO TABS: 150.0000 mg | ORAL_TABLET | Freq: Every day | ORAL | 0 refills | Status: DC

## 2020-08-31 NOTE — Telephone Encounter (Signed)
Preadmission screen  

## 2020-08-31 NOTE — Discharge Instructions (Signed)
New Induction of Labor Process for [facility]  In Fall 2020 [facility] changed it's process for scheduling inductions of labor to create more induction slots and to make sure patients get COVID-19 testing in advance. After you have been tested you need to quarantine so that you do not get infected after your test. You should not go anywhere after your test except necessary medical appointments.  You have been scheduled for induction of labor on 09/05/20. Although you may have a specific time listed on your After Visit Summary or MyChart, we cannot predict when your room will be available. Please disregard this time. A Labor and Delivery staff member will call you on the day that you are scheduled when your room is available. You will need to arrive within one hour of being called. If you do not arrive within this time frame, the next person on the list will be called in and you will move down the list. You may eat a light meal before coming to the hospital. If you go into labor, think your water has broken, experience bright red bleeding or don't feel your baby moving as much as usual before your induction, please call your Ob/Gyn's office or come to Entrance C, Maternity Assessment Unit for evaluation.  Thank you,  [facility] [employer] [facility]

## 2020-09-01 ENCOUNTER — Telehealth: Payer: Self-pay | Admitting: Obstetrics & Gynecology

## 2020-09-01 ENCOUNTER — Encounter: Payer: Medicaid Other | Admitting: Nurse Practitioner

## 2020-09-01 LAB — CULTURE, OB URINE

## 2020-09-01 NOTE — Telephone Encounter (Signed)
Receives a call from a provider from [facility] for patient to get her BTL. Per Dr. Macon Large, if patient has signed her consent, tubal can be done.

## 2020-09-02 ENCOUNTER — Encounter: Payer: Self-pay | Admitting: Advanced Practice Midwife

## 2020-09-02 ENCOUNTER — Telehealth (HOSPITAL_COMMUNITY): Payer: Self-pay | Admitting: *Deleted

## 2020-09-02 ENCOUNTER — Telehealth: Payer: Self-pay | Admitting: Family Medicine

## 2020-09-02 DIAGNOSIS — B951 Streptococcus, group B, as the cause of diseases classified elsewhere: Secondary | ICD-10-CM | POA: Insufficient documentation

## 2020-09-02 NOTE — Telephone Encounter (Signed)
Preadmission screen  

## 2020-09-02 NOTE — Telephone Encounter (Signed)
Dr. Quentin Mulling from [facility] called to advise that pt delivered at their facility and they are requesting to TUBAL consent that pt signed here 07-15-20.  [facility] faxed a signed consent from pt to release info from [facility].  I faxed to consent form for Tubal to [facility] attn: Dr. Wendi Maya

## 2020-09-03 ENCOUNTER — Other Ambulatory Visit (HOSPITAL_COMMUNITY): Admission: RE | Admit: 2020-09-03 | Payer: Medicaid Other | Source: Ambulatory Visit

## 2020-09-04 ENCOUNTER — Other Ambulatory Visit: Payer: Self-pay | Admitting: Advanced Practice Midwife

## 2020-09-05 ENCOUNTER — Inpatient Hospital Stay (HOSPITAL_COMMUNITY): Payer: Medicaid Other

## 2020-09-05 ENCOUNTER — Inpatient Hospital Stay (HOSPITAL_COMMUNITY)
Admission: AD | Admit: 2020-09-05 | Payer: Medicaid Other | Source: Home / Self Care | Admitting: Obstetrics & Gynecology

## 2020-10-24 ENCOUNTER — Ambulatory Visit: Payer: Medicaid Other | Admitting: Obstetrics and Gynecology

## 2024-01-20 ENCOUNTER — Telehealth (INDEPENDENT_AMBULATORY_CARE_PROVIDER_SITE_OTHER): Payer: Self-pay | Admitting: Primary Care

## 2024-01-20 NOTE — Telephone Encounter (Signed)
 Called pt to confirm appt. Pt will be present.

## 2024-01-21 ENCOUNTER — Encounter (INDEPENDENT_AMBULATORY_CARE_PROVIDER_SITE_OTHER): Payer: Self-pay | Admitting: Primary Care

## 2024-01-21 ENCOUNTER — Other Ambulatory Visit (HOSPITAL_COMMUNITY)
Admission: RE | Admit: 2024-01-21 | Discharge: 2024-01-21 | Disposition: A | Discharge status: Home / Self Care | Source: Ambulatory Visit | Attending: Primary Care | Admitting: Primary Care

## 2024-01-21 ENCOUNTER — Ambulatory Visit (INDEPENDENT_AMBULATORY_CARE_PROVIDER_SITE_OTHER): Payer: Medicaid Other | Admitting: Primary Care

## 2024-01-21 VITALS — BP 134/88 | HR 80 | Resp 16 | Ht 65.0 in | Wt 136.4 lb

## 2024-01-21 DIAGNOSIS — Z13220 Encounter for screening for lipoid disorders: Secondary | ICD-10-CM

## 2024-01-21 DIAGNOSIS — Z113 Encounter for screening for infections with a predominantly sexual mode of transmission: Secondary | ICD-10-CM

## 2024-01-21 DIAGNOSIS — N920 Excessive and frequent menstruation with regular cycle: Secondary | ICD-10-CM | POA: Diagnosis not present

## 2024-01-21 DIAGNOSIS — Z124 Encounter for screening for malignant neoplasm of cervix: Secondary | ICD-10-CM | POA: Insufficient documentation

## 2024-01-21 DIAGNOSIS — B9689 Other specified bacterial agents as the cause of diseases classified elsewhere: Secondary | ICD-10-CM | POA: Insufficient documentation

## 2024-01-21 DIAGNOSIS — Z2821 Immunization not carried out because of patient refusal: Secondary | ICD-10-CM | POA: Diagnosis not present

## 2024-01-21 DIAGNOSIS — N760 Acute vaginitis: Secondary | ICD-10-CM | POA: Insufficient documentation

## 2024-01-21 DIAGNOSIS — R030 Elevated blood-pressure reading, without diagnosis of hypertension: Secondary | ICD-10-CM

## 2024-01-21 NOTE — Progress Notes (Signed)
 New Patient Office Visit   [facility]  [facility] Patient name: Brandy Perez MRN [id number]  Date of birth: [birth year] Chief Complaint:   [name] is a 28 year old female in today for establishment of care.  She voices no problems or concerns other than needing a gynecological checkup. History of Present Illness:   ON:GEXB gyn The current method of family planning is tubal ligation.  Patient's last menstrual period was 12/31/2023. Last pap 05/05/20. Results were: normal  Family h/o breast cancer: No  Family h/o colorectal cancer: No  Health Maintenance  Topic Date Due   HPV Vaccine (2 - 2-dose series) 12/11/2006   COVID-19 Vaccine (1 - 2024-25 season) Never done   DTaP/Tdap/Td vaccine (7 - Td or Tdap) 01/20/2025*   Flu Shot  05/01/2024   Pap Smear  01/21/2027   Hepatitis C Screening  Completed   HIV Screening  Completed   Meningitis B Vaccine  Aged Out   Review of Systems:    Denies any headaches, blurred vision, fatigue, shortness of breath, chest pain, abdominal pain, abnormal vaginal discharge/itching/odor/irritation, problems with periods, bowel movements, urination, or intercourse unless otherwise stated above.  Pertinent History Reviewed:   Reviewed past medical,surgical, social and family history.  Reviewed problem list, medications and allergies. Health Maintenance  Topic Date Due   HPV Vaccine (2 - 2-dose series) 12/11/2006   Pap Smear  05/06/2023   COVID-19 Vaccine (1 - 2024-25 season) Never done   DTaP/Tdap/Td vaccine (7 - Td or Tdap) 01/20/2025*   Flu Shot  05/01/2024   Hepatitis C Screening  Completed   HIV Screening  Completed   Meningitis B Vaccine  Aged Out   Physical Assessment:   Vitals:   01/21/24 0905 01/21/24 0909 01/21/24 0950  BP: (!) 137/98 (!) 139/99 134/88  Pulse: 80    Resp: 16    SpO2: 99%    Weight: 136 lb 6.4 oz (61.9 kg)    Height: 5\' 5"  (1.651 m)    Body mass index is 22.7 kg/m.         Physical Examination:  General appearance - well appearing, and in no distress Mental status - alert, oriented to person, place, and time Psych:  She has a normal mood and affect Skin - warm and dry, normal color, no suspicious lesions noted Chest - effort normal, all lung fields clear to auscultation bilaterally Heart - normal rate and regular rhythm Neck:  midline trachea, no thyromegaly or nodules Breasts - breasts appear normal, no suspicious masses, no skin or nipple changes or axillary nodes Educated patient on proper self breast examination and had patient to demonstrate SBE. Abdomen - soft, nontender, nondistended, no masses or organomegaly Pelvic-VULVA: normal appearing vulva with no masses, tenderness or lesions   VAGINA: normal appearing vagina with normal color and discharge, no lesions   CERVIX: normal appearing cervix without discharge or lesions, no CMT UTERUS: uterus is felt to be normal size, shape, consistency and nontender  ADNEXA: No adnexal masses or tenderness noted. Extremities:  No swelling or varicosities noted  Orders Placed This Encounter  Procedures   CBC with Differential/Platelet   CMP14+EGFR   Lipid panel   HIV antibody (with reflex)   Assessment & Plan:  Alahnna was seen today for new patient (initial visit) and gynecologic exam.  Diagnoses and all orders for this visit:  Cervical cancer screening -     Cytology - PAP  Tetanus, diphtheria, and acellular pertussis (  Tdap) vaccination declined  Screening for STD (sexually transmitted disease) -     Cervicovaginal ancillary only -     HIV antibody (with reflex); Future  Menorrhagia with regular cycle -     CBC with Differential/Platelet; Future -     CMP14+EGFR; Future  Lipid screening -     Lipid panel; Future  Elevated blood pressure reading in  without diagnosis of hypertension DIET: Limit salt intake, read nutrition labels to check salt content, limit fried and high fatty foods  Avoid  using multisymptom OTC cold preparations that generally contain sudafed which can rise BP. Consult with pharmacist on best cold relief products to use for persons with HTN EXERCISE Discussed incorporating exercise such as walking - 30 minutes most days of the week and can do in 10 minute intervals    Preventing hypertension placed on AVS  Meds: No orders of the defined types were placed in this encounter.   Follow-up: Return in about 3 months (around 04/21/2024).  The above assessment and management plan was discussed with the patient. The patient verbalized understanding of and has agreed to the management plan. Patient is aware to call the clinic if symptoms fail to improve or worsen. Patient is aware when to return to the clinic for a follow-up visit. Patient educated on when it is appropriate to go to the emergency department.   Madelyn Schick, NP-C This note has been created with Education officer, environmental. Any transcriptional errors are unintentional.

## 2024-01-21 NOTE — Patient Instructions (Signed)
 Preventing Hypertension Hypertension, also called high blood pressure, is when the force of blood pumping through the arteries is too strong. Arteries are blood vessels that carry blood from the heart throughout the body. Often, hypertension does not cause symptoms until blood pressure is very high. It is important to have your blood pressure checked regularly. Diet and lifestyle changes can help you prevent hypertension, and they may make you feel better overall and improve your quality of life. If you already have hypertension, you may control it with diet and lifestyle changes, as well as with medicine. How can this condition affect me? Over time, hypertension can damage the arteries and decrease blood flow to important parts of the body, including the brain, heart, and kidneys. By keeping your blood pressure in a healthy range, you can help prevent complications like heart attack, heart failure, stroke, kidney failure, and vascular dementia. What can increase my risk? An unhealthy diet and a lack of physical activity can make you more likely to develop high blood pressure. Some other risk factors include: Age. The risk increases with age. Having family members who have had high blood pressure. Having certain health conditions, such as thyroid problems. Being overweight or obese. Drinking too much alcohol or caffeine. Having too much fat, sugar, calories, or salt (sodium) in your diet. Smoking or using illegal drugs. Taking certain medicines, such as antidepressants, decongestants, birth control pills, and NSAIDs, such as ibuprofen. What actions can I take to prevent or manage this condition? Work with your health care provider to make a hypertension prevention plan that works for you. You may be referred for counseling on a healthy diet and physical activity. Follow your plan and keep all follow-up visits. Diet changes Maintain a healthy diet. This includes: Eating less salt (sodium). Ask your  health care provider how much sodium is safe for you to have. The general recommendation is to have less than 1 tsp (2,300 mg) of sodium a day. Do not add salt to your food. Choose low-sodium options when grocery shopping and eating out. Limiting fats in your diet. You can do this by eating low-fat or fat-free dairy products and by eating less red meat. Eating more fruits, vegetables, and whole grains. Make a goal to eat: 1-2 cups of fresh fruits and vegetables each day. 3-4 servings of whole grains each day. Avoiding foods and beverages that have added sugars. Eating fish that contain healthy fats (omega-3 fatty acids), such as mackerel or salmon. If you need help putting together a healthy eating plan, try the DASH diet. This diet is high in fruits, vegetables, and whole grains. It is low in sodium, red meat, and added sugars. DASH stands for Dietary Approaches to Stop Hypertension. Lifestyle changes  Lose weight if you are overweight. Losing just 3-5% of your body weight can help prevent or control hypertension. For example, if your present weight is 200 lb (91 kg), a loss of 3-5% of your weight means losing 6-10 lb (2.7-4.5 kg). Ask your health care provider to help you with a diet and exercise plan to safely lose weight. Get enough exercise. Do at least 150 minutes of moderate-intensity exercise each week. You could do this in short exercise sessions several times a day, or you could do longer exercise sessions a few times a week. For example, you could take a brisk 10-minute walk or bike ride, 3 times a day, for 5 days a week. Find ways to reduce stress, such as exercising, meditating, listening to  music, or taking a yoga class. If you need help reducing stress, ask your health care provider. Do not use any products that contain nicotine or tobacco. These products include cigarettes, chewing tobacco, and vaping devices, such as e-cigarettes. Chemicals in tobacco and nicotine products raise your  blood pressure each time you use them. If you need help quitting, ask your health care provider. Learn how to check your blood pressure at home. Make sure that you know your personal target blood pressure, as told by your health care provider. Try to sleep 7-9 hours per night. Alcohol use Do not drink alcohol if: Your health care provider tells you not to drink. You are pregnant, may be pregnant, or are planning to become pregnant. If you drink alcohol: Limit how much you have to: 0-1 drink a day for women. 0-2 drinks a day for men. Know how much alcohol is in your drink. In the [place] one drink equals one 12 oz bottle of beer (355 mL), one 5 oz glass of wine (148 mL), or one 1 oz glass of hard liquor (44 mL). Medicines In addition to diet and lifestyle changes, your health care provider may recommend medicines to help lower your blood pressure. In general: You may need to try a few different medicines to find what works best for you. You may need to take more than one medicine. Take over-the-counter and prescription medicines only as told by your health care provider. Questions to ask your health care provider What is my blood pressure goal? How can I lower my risk for high blood pressure? How should I monitor my blood pressure at home? Where to find support Your health care provider can help you prevent hypertension and help you keep your blood pressure at a healthy level. Your local hospital or your community may also provide support services and prevention programs. The American Heart Association offers an online support network at supportnetwork.heart.org Where to find more information Learn more about hypertension from: National Heart, Lung, and Blood Institute: PopSteam.is Centers for Disease Control and Prevention: FootballExhibition.com.br American Academy of Family Physicians: familydoctor.org Learn more about the DASH diet from: National Heart, Lung, and Blood Institute:  PopSteam.is Contact a health care provider if: You think you are having a reaction to medicines you have taken. You have recurrent headaches or feel dizzy. You have swelling in your ankles. You have trouble with your vision. Get help right away if: You have sudden, severe chest, back, or abdominal pain or discomfort. You have shortness of breath. You have a sudden, severe headache. These symptoms may be an emergency. Get help right away. Call 911. Do not wait to see if the symptoms will go away. Do not drive yourself to the hospital. Summary Hypertension often does not cause any symptoms until blood pressure is very high. It is important to get your blood pressure checked regularly. Diet and lifestyle changes are important steps in preventing hypertension. By keeping your blood pressure in a healthy range, you may prevent complications like heart attack, heart failure, stroke, and kidney failure. Work with your health care provider to make a hypertension prevention plan that works for you. This information is not intended to replace advice given to you by your health care provider. Make sure you discuss any questions you have with your health care provider. Document Revised: 07/06/2021 Document Reviewed: 07/06/2021 Elsevier Patient Education  2024 [employer].

## 2024-01-22 LAB — CERVICOVAGINAL ANCILLARY ONLY
Bacterial Vaginitis (gardnerella): POSITIVE — AB
Candida Glabrata: NEGATIVE
Candida Vaginitis: NEGATIVE
Chlamydia: NEGATIVE
Comment: NEGATIVE
Comment: NEGATIVE
Comment: NEGATIVE
Comment: NEGATIVE
Comment: NEGATIVE
Comment: NORMAL
Neisseria Gonorrhea: NEGATIVE
Trichomonas: NEGATIVE

## 2024-01-23 LAB — CYTOLOGY - PAP
Comment: NEGATIVE
Diagnosis: NEGATIVE
High risk HPV: NEGATIVE

## 2024-01-24 ENCOUNTER — Other Ambulatory Visit (INDEPENDENT_AMBULATORY_CARE_PROVIDER_SITE_OTHER)

## 2024-01-25 ENCOUNTER — Other Ambulatory Visit (INDEPENDENT_AMBULATORY_CARE_PROVIDER_SITE_OTHER): Payer: Self-pay | Admitting: Primary Care

## 2024-01-25 ENCOUNTER — Encounter (INDEPENDENT_AMBULATORY_CARE_PROVIDER_SITE_OTHER): Payer: Self-pay | Admitting: Primary Care

## 2024-01-25 DIAGNOSIS — N760 Acute vaginitis: Secondary | ICD-10-CM

## 2024-01-25 MED ORDER — METRONIDAZOLE 500 MG PO TABS: 500.0000 mg | ORAL_TABLET | Freq: Two times a day (BID) | ORAL | 0 refills | Status: AC

## 2024-02-13 ENCOUNTER — Telehealth (INDEPENDENT_AMBULATORY_CARE_PROVIDER_SITE_OTHER): Payer: Self-pay | Admitting: Primary Care

## 2024-02-13 NOTE — Telephone Encounter (Signed)
 Called pt to confirm appt. Pt will be present.

## 2024-02-17 ENCOUNTER — Telehealth (INDEPENDENT_AMBULATORY_CARE_PROVIDER_SITE_OTHER): Payer: Self-pay | Admitting: Primary Care

## 2024-02-17 NOTE — Telephone Encounter (Signed)
 Spoke to pt about upcoming appt.. Will be present

## 2024-02-18 ENCOUNTER — Ambulatory Visit (INDEPENDENT_AMBULATORY_CARE_PROVIDER_SITE_OTHER): Payer: Self-pay | Admitting: Primary Care

## 2024-02-18 ENCOUNTER — Telehealth (INDEPENDENT_AMBULATORY_CARE_PROVIDER_SITE_OTHER): Payer: Self-pay | Admitting: Primary Care

## 2024-02-18 NOTE — Telephone Encounter (Signed)
 Called pt to reschedule appt. Appt was scheduled and pt will be present at next.

## 2024-02-20 ENCOUNTER — Telehealth (INDEPENDENT_AMBULATORY_CARE_PROVIDER_SITE_OTHER): Payer: Self-pay | Admitting: Primary Care

## 2024-02-20 NOTE — Telephone Encounter (Signed)
 Spoke to pt about upcoming appt.. Will be present

## 2024-02-21 ENCOUNTER — Telehealth (INDEPENDENT_AMBULATORY_CARE_PROVIDER_SITE_OTHER): Admitting: Primary Care

## 2024-02-21 ENCOUNTER — Telehealth (INDEPENDENT_AMBULATORY_CARE_PROVIDER_SITE_OTHER): Payer: Self-pay

## 2024-02-21 NOTE — Telephone Encounter (Signed)
 error

## 2024-04-20 ENCOUNTER — Telehealth (INDEPENDENT_AMBULATORY_CARE_PROVIDER_SITE_OTHER): Payer: Self-pay | Admitting: Primary Care

## 2024-04-20 NOTE — Telephone Encounter (Signed)
 Called pt to confirm appt. Pt's phone us  unavailable.

## 2024-04-21 ENCOUNTER — Telehealth (INDEPENDENT_AMBULATORY_CARE_PROVIDER_SITE_OTHER): Payer: Self-pay | Admitting: Primary Care

## 2024-04-21 ENCOUNTER — Ambulatory Visit (INDEPENDENT_AMBULATORY_CARE_PROVIDER_SITE_OTHER): Payer: Self-pay | Admitting: Primary Care

## 2024-04-21 NOTE — Telephone Encounter (Signed)
 Called pt to confirm appt. Pt's phone is unavailable.
